# Patient Record
Sex: Female | Born: 1995
Health system: Southern US, Community
[De-identification: ages and names within clinical notes are randomized; demographics above are authoritative.]

## PROBLEM LIST (undated history)

## (undated) DIAGNOSIS — K760 Fatty (change of) liver, not elsewhere classified: Secondary | ICD-10-CM

## (undated) DIAGNOSIS — T7840XA Allergy, unspecified, initial encounter: Secondary | ICD-10-CM

## (undated) HISTORY — DX: Fatty (change of) liver, not elsewhere classified: K76.0

## (undated) HISTORY — PX: NO PAST SURGERIES: SHX2092

## (undated) HISTORY — DX: Allergy, unspecified, initial encounter: T78.40XA

---
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
## 2018-03-13 NOTE — Progress Notes (Signed)
Subjective:    Patient ID: Marissa [name], female    DOB: 08/20/1995, 23 y.o.   MRN: [id number]  HPI The patient is here for follow up.  Anxiety: two years ago she was on fluoxetine for anxiety and she thought it worked well.  She stopped it after a few months because she did not think she needed it anymore.  Over the past two months she has been very anxious.  Her parents have noticed her anxiety and encouraged her to come in.  She has difficulty falling asleep but once asleep sleeps well, feels tired all the time, she has a decreased appetite and has had palpitations on occasion.  She denies any depression.  She would like to go back on the fluoxetine.  She is a Holiday representative at [employer].  Medications and allergies reviewed with patient and updated if appropriate.  Patient Active Problem List   Diagnosis Date Noted  . Anxiety 12/21/2015    Current Outpatient Medications on File Prior to Visit  Medication Sig Dispense Refill  . FLUoxetine (PROZAC) 20 MG tablet Take 20 mg by mouth daily.     No current facility-administered medications on file prior to visit.     Past Medical History:  Diagnosis Date  . Allergy     Past Surgical History:  Procedure Laterality Date  . NO PAST SURGERIES      Social History   Socioeconomic History  . Marital status: Single    Spouse name: Not on file  . Number of children: Not on file  . Years of education: Not on file  . Highest education level: Not on file  Occupational History  . Not on file  Social Needs  . Financial resource strain: Not on file  . Food insecurity:    Worry: Not on file    Inability: Not on file  . Transportation needs:    Medical: Not on file    Non-medical: Not on file  Tobacco Use  . Smoking status: Never Smoker  . Smokeless tobacco: Never Used  Substance and Sexual Activity  . Alcohol use: No  . Drug use: No  . Sexual activity: Not on file  Lifestyle  . Physical activity:    Days per  week: Not on file    Minutes per session: Not on file  . Stress: Not on file  Relationships  . Social connections:    Talks on phone: Not on file    Gets together: Not on file    Attends religious service: Not on file    Active member of club or organization: Not on file    Attends meetings of clubs or organizations: Not on file    Relationship status: Not on file  Other Topics Concern  . Not on file  Social History Narrative  . Not on file    Family History  Problem Relation Age of Onset  . Heart disease Father   . Renal cancer Maternal Grandfather   . Cancer Maternal Grandfather        kidney    Review of Systems  Constitutional: Positive for appetite change (decreased).  Respiratory: Negative for shortness of breath.   Cardiovascular: Positive for palpitations (occ with anxiety). Negative for chest pain.  Gastrointestinal: Positive for nausea (sometimes in morning).  Neurological: Positive for light-headedness. Negative for headaches.  Psychiatric/Behavioral: Positive for sleep disturbance (falling asleep only). Negative for decreased concentration and dysphoric mood. The patient is nervous/anxious.  Objective:   Vitals:   03/14/18 0743  BP: 108/70  Pulse: 74  Resp: 16  Temp: 98.4 F (36.9 C)  SpO2: 98%   BP Readings from Last 3 Encounters:  03/14/18 108/70  05/24/16 104/76  12/21/15 104/74   Wt Readings from Last 3 Encounters:  03/14/18 185 lb (83.9 kg)  05/24/16 203 lb (92.1 kg) (98 %, Z= 1.99)*  12/21/15 201 lb (91.2 kg) (98 %, Z= 1.97)*   * Growth percentiles are based on CDC (Girls, 2-20 Years) data.   Body mass index is 28.13 kg/m.   Physical Exam    Constitutional: Appears well-developed and well-nourished. No distress.  HENT:  Head: Normocephalic and atraumatic.  Neck: Neck supple. No tracheal deviation present. No thyromegaly present.  No cervical lymphadenopathy Cardiovascular: Normal rate, regular rhythm and normal heart sounds.     No murmur heard. No carotid bruit .  No edema Pulmonary/Chest: Effort normal and breath sounds normal. No respiratory distress. No has no wheezes. No rales.  Skin: Skin is warm and dry. Not diaphoretic.  Psychiatric: Normal mood and affect. Behavior is normal.      Assessment & Plan:    See Problem List for Assessment and Plan of chronic medical problems.

## 2018-03-14 ENCOUNTER — Ambulatory Visit: Payer: BLUE CROSS/BLUE SHIELD | Admitting: Internal Medicine

## 2018-03-14 ENCOUNTER — Encounter: Payer: Self-pay | Admitting: Internal Medicine

## 2018-03-14 VITALS — BP 108/70 | HR 74 | Temp 98.4°F | Resp 16 | Ht 68.0 in | Wt 185.0 lb

## 2018-03-14 DIAGNOSIS — F419 Anxiety disorder, unspecified: Secondary | ICD-10-CM | POA: Diagnosis not present

## 2018-03-14 DIAGNOSIS — Z23 Encounter for immunization: Secondary | ICD-10-CM | POA: Diagnosis not present

## 2018-03-14 MED ORDER — FLUOXETINE HCL 20 MG PO TABS: 20.0000 mg | ORAL_TABLET | Freq: Every day | ORAL | 1 refills | Status: DC

## 2018-03-14 NOTE — Assessment & Plan Note (Signed)
Like to restart fluoxetine-we will restart at 20 mg daily She has taken the medication before and tolerated it well.  She will let me know if this is not helping. Discussed that this is no medication she should take if she was to become pregnant Encouraged regular exercise Follow-up in 6 months, sooner if needed

## 2018-03-14 NOTE — Patient Instructions (Addendum)
  Flu immunization administered today.    Medications reviewed and updated.  Changes include :   Restart fluoxetine.  Your prescription(s) have been submitted to your pharmacy. Please take as directed and contact our office if you believe you are having problem(s) with the medication(s).   Please followup in 6 months

## 2018-03-14 NOTE — Addendum Note (Signed)
Addended by: Mercer Pod E on: 03/14/2018 09:08 AM   Modules accepted: Orders

## 2018-08-29 ENCOUNTER — Telehealth: Payer: Self-pay | Admitting: Internal Medicine

## 2018-08-29 DIAGNOSIS — Z20822 Contact with and (suspected) exposure to covid-19: Secondary | ICD-10-CM

## 2018-08-29 NOTE — Telephone Encounter (Signed)
Dr. Quay Burow is ok with pt getting tested for COVID. Was recently at [facility] but has no symptoms. Call back number [phone]

## 2018-08-29 NOTE — Telephone Encounter (Signed)
Patient was recently in [facility] and is now concerned with possibly having COVID. She is showing no symptoms.  Per Dr Quay Burow, okay to send order for [facility] to schedule testing.

## 2018-08-29 NOTE — Telephone Encounter (Signed)
Scheduled patient for testing 09/02/2018 at [facility] at 8:30 am.  Testing protocol reviewed with patient.

## 2018-09-02 ENCOUNTER — Other Ambulatory Visit: Payer: BLUE CROSS/BLUE SHIELD

## 2018-09-02 DIAGNOSIS — R6889 Other general symptoms and signs: Secondary | ICD-10-CM | POA: Diagnosis not present

## 2018-09-02 DIAGNOSIS — Z20822 Contact with and (suspected) exposure to covid-19: Secondary | ICD-10-CM

## 2018-09-07 LAB — NOVEL CORONAVIRUS, NAA: SARS-CoV-2, NAA: NOT DETECTED

## 2018-09-12 NOTE — Progress Notes (Signed)
Subjective:    Patient ID: Marissa [name], female    DOB: 1995/10/28, 23 y.o.   MRN: [id number]  HPI The patient is here for follow up.  We restarted her Prozac last January.  She had stopped it a few months earlier because she did not think she needed anymore, but started to feel more anxious.  Anxiety: She is taking her medication daily as prescribed. She denies any side effects from the medication. She feels her anxiety is well controlled and she is happy with her current dose of medication.    Knee pain:  She played sports when she was younger and has been having knee pain.  Her knees crunch.  She can not sit with her legs crisscrossed because it hurts.  She denies pain with going up stairs.  She has soreness in her knees after standing long periods.  She works out pretty regularly.     Medications and allergies reviewed with patient and updated if appropriate.  Patient Active Problem List   Diagnosis Date Noted  . Anxiety 12/21/2015    Current Outpatient Medications on File Prior to Visit  Medication Sig Dispense Refill  . FLUoxetine (PROZAC) 20 MG tablet Take 1 tablet (20 mg total) by mouth daily. 90 tablet 1   No current facility-administered medications on file prior to visit.     Past Medical History:  Diagnosis Date  . Allergy     Past Surgical History:  Procedure Laterality Date  . NO PAST SURGERIES      Social History   Socioeconomic History  . Marital status: Single    Spouse name: Not on file  . Number of children: Not on file  . Years of education: Not on file  . Highest education level: Not on file  Occupational History  . Not on file  Social Needs  . Financial resource strain: Not on file  . Food insecurity    Worry: Not on file    Inability: Not on file  . Transportation needs    Medical: Not on file    Non-medical: Not on file  Tobacco Use  . Smoking status: Never Smoker  . Smokeless tobacco: Never Used  Substance and Sexual Activity   . Alcohol use: No  . Drug use: No  . Sexual activity: Not on file  Lifestyle  . Physical activity    Days per week: Not on file    Minutes per session: Not on file  . Stress: Not on file  Relationships  . Social Herbalist on phone: Not on file    Gets together: Not on file    Attends religious service: Not on file    Active member of club or organization: Not on file    Attends meetings of clubs or organizations: Not on file    Relationship status: Not on file  Other Topics Concern  . Not on file  Social History Narrative  . Not on file    Family History  Problem Relation Age of Onset  . Heart disease Father   . Renal cancer Maternal Grandfather   . Cancer Maternal Grandfather        kidney    Review of Systems  Constitutional: Negative for appetite change.  Gastrointestinal: Negative for nausea.  Musculoskeletal: Positive for arthralgias (knee pain).  Neurological: Negative for headaches.  Psychiatric/Behavioral: Positive for sleep disturbance. Negative for dysphoric mood. The patient is nervous/anxious.  Objective:   Vitals:   09/13/18 1405  BP: 110/80  Pulse: 86  Temp: 98.5 F (36.9 C)  SpO2: 98%   BP Readings from Last 3 Encounters:  09/13/18 110/80  03/14/18 108/70  05/24/16 104/76   Wt Readings from Last 3 Encounters:  09/13/18 186 lb 4 oz (84.5 kg)  03/14/18 185 lb (83.9 kg)  05/24/16 203 lb (92.1 kg) (98 %, Z= 1.99)*   * Growth percentiles are based on CDC (Girls, 2-20 Years) data.   Body mass index is 28.32 kg/m.   Physical Exam    Constitutional: Appears well-developed and well-nourished. No distress.  KNEES: Skin intact without bruising.  No swelling or effusion.  Normal range of motion of both knees. MSK: No calf tenderness Skin: Skin is warm and dry. Not diaphoretic.  Psychiatric: Normal mood and affect. Behavior is normal.      Assessment & Plan:    See Problem List for Assessment and Plan of chronic  medical problems.

## 2018-09-13 ENCOUNTER — Other Ambulatory Visit: Payer: Self-pay

## 2018-09-13 ENCOUNTER — Encounter: Payer: Self-pay | Admitting: Internal Medicine

## 2018-09-13 ENCOUNTER — Ambulatory Visit (INDEPENDENT_AMBULATORY_CARE_PROVIDER_SITE_OTHER): Payer: BC Managed Care – PPO | Admitting: Internal Medicine

## 2018-09-13 VITALS — BP 110/80 | HR 86 | Temp 98.5°F | Ht 68.0 in | Wt 186.2 lb

## 2018-09-13 DIAGNOSIS — G8929 Other chronic pain: Secondary | ICD-10-CM | POA: Diagnosis not present

## 2018-09-13 DIAGNOSIS — F419 Anxiety disorder, unspecified: Secondary | ICD-10-CM | POA: Diagnosis not present

## 2018-09-13 DIAGNOSIS — M25569 Pain in unspecified knee: Secondary | ICD-10-CM | POA: Insufficient documentation

## 2018-09-13 DIAGNOSIS — M25562 Pain in left knee: Secondary | ICD-10-CM

## 2018-09-13 DIAGNOSIS — M25561 Pain in right knee: Secondary | ICD-10-CM | POA: Diagnosis not present

## 2018-09-13 DIAGNOSIS — Z23 Encounter for immunization: Secondary | ICD-10-CM | POA: Diagnosis not present

## 2018-09-13 NOTE — Assessment & Plan Note (Signed)
Anxiety currently well controlled Tolerating medication without side effects Continue fluoxetine 20 mg daily Follow-up in 6 months, sooner if needed

## 2018-09-13 NOTE — Assessment & Plan Note (Signed)
Mild pain overall No acute injury Crunching/popping is not associated with pain-reassured No concerning findings on exam Continue regular exercise and strengthening of the muscles around the knee Avoid activities that cause pain If concerns continue consider sports medicine

## 2018-09-13 NOTE — Patient Instructions (Signed)
Continue your current medication.   Follow up in 6 months sooner if needed.

## 2018-09-20 ENCOUNTER — Ambulatory Visit: Payer: BC Managed Care – PPO

## 2018-09-23 ENCOUNTER — Other Ambulatory Visit: Payer: Self-pay

## 2018-09-23 ENCOUNTER — Ambulatory Visit (INDEPENDENT_AMBULATORY_CARE_PROVIDER_SITE_OTHER): Payer: BC Managed Care – PPO

## 2018-09-23 DIAGNOSIS — Z299 Encounter for prophylactic measures, unspecified: Secondary | ICD-10-CM

## 2018-09-23 DIAGNOSIS — Z23 Encounter for immunization: Secondary | ICD-10-CM | POA: Diagnosis not present

## 2018-10-22 DIAGNOSIS — Z6828 Body mass index (BMI) 28.0-28.9, adult: Secondary | ICD-10-CM | POA: Diagnosis not present

## 2018-10-22 DIAGNOSIS — Z01419 Encounter for gynecological examination (general) (routine) without abnormal findings: Secondary | ICD-10-CM | POA: Diagnosis not present

## 2018-10-22 DIAGNOSIS — Z118 Encounter for screening for other infectious and parasitic diseases: Secondary | ICD-10-CM | POA: Diagnosis not present

## 2018-10-24 ENCOUNTER — Ambulatory Visit (INDEPENDENT_AMBULATORY_CARE_PROVIDER_SITE_OTHER): Payer: BC Managed Care – PPO

## 2018-10-24 DIAGNOSIS — Z23 Encounter for immunization: Secondary | ICD-10-CM

## 2018-10-29 ENCOUNTER — Other Ambulatory Visit: Payer: Self-pay | Admitting: Internal Medicine

## 2018-11-24 NOTE — Progress Notes (Signed)
xx This encounter was created in error - please disregard.

## 2018-11-25 ENCOUNTER — Other Ambulatory Visit: Payer: Self-pay | Admitting: Internal Medicine

## 2018-11-25 ENCOUNTER — Encounter: Payer: BC Managed Care – PPO | Admitting: Internal Medicine

## 2018-11-25 NOTE — Telephone Encounter (Signed)
Pt has a appt today @ 2:45 forwarding to Joes until she arrive for appt to refill....Johny Chess

## 2018-11-26 ENCOUNTER — Other Ambulatory Visit: Payer: Self-pay

## 2018-11-26 ENCOUNTER — Other Ambulatory Visit: Payer: Self-pay | Admitting: Internal Medicine

## 2018-11-26 DIAGNOSIS — Z20822 Contact with and (suspected) exposure to covid-19: Secondary | ICD-10-CM

## 2018-11-26 MED ORDER — FLUOXETINE HCL 20 MG PO TABS: 20.0000 mg | ORAL_TABLET | Freq: Every day | ORAL | 0 refills | Status: DC

## 2018-11-27 LAB — NOVEL CORONAVIRUS, NAA: SARS-CoV-2, NAA: NOT DETECTED

## 2018-12-03 ENCOUNTER — Encounter: Payer: Self-pay | Admitting: Internal Medicine

## 2018-12-04 ENCOUNTER — Other Ambulatory Visit: Payer: Self-pay

## 2018-12-04 DIAGNOSIS — Z20828 Contact with and (suspected) exposure to other viral communicable diseases: Secondary | ICD-10-CM | POA: Diagnosis not present

## 2018-12-04 DIAGNOSIS — Z20822 Contact with and (suspected) exposure to covid-19: Secondary | ICD-10-CM

## 2018-12-06 LAB — NOVEL CORONAVIRUS, NAA: SARS-CoV-2, NAA: DETECTED — AB

## 2019-02-05 ENCOUNTER — Ambulatory Visit (INDEPENDENT_AMBULATORY_CARE_PROVIDER_SITE_OTHER)
Admission: RE | Admit: 2019-02-05 | Discharge: 2019-02-05 | Disposition: A | Discharge status: Home / Self Care | Payer: BC Managed Care – PPO | Source: Ambulatory Visit | Attending: Internal Medicine | Admitting: Internal Medicine

## 2019-02-05 ENCOUNTER — Ambulatory Visit (INDEPENDENT_AMBULATORY_CARE_PROVIDER_SITE_OTHER): Payer: BC Managed Care – PPO | Admitting: Internal Medicine

## 2019-02-05 ENCOUNTER — Encounter: Payer: Self-pay | Admitting: Internal Medicine

## 2019-02-05 ENCOUNTER — Other Ambulatory Visit: Payer: Self-pay

## 2019-02-05 VITALS — BP 118/80 | HR 93 | Temp 98.6°F | Resp 16 | Ht 68.0 in

## 2019-02-05 DIAGNOSIS — S99912A Unspecified injury of left ankle, initial encounter: Secondary | ICD-10-CM | POA: Diagnosis not present

## 2019-02-05 DIAGNOSIS — M25572 Pain in left ankle and joints of left foot: Secondary | ICD-10-CM

## 2019-02-05 DIAGNOSIS — R600 Localized edema: Secondary | ICD-10-CM | POA: Diagnosis not present

## 2019-02-05 NOTE — Progress Notes (Signed)
Subjective:    Patient ID: Marissa [name], female    DOB: 03-12-1995, 23 y.o.   MRN: [id number]  HPI The patient is here for an acute visit.   Left ankle pain:   12/8 and she stepped out of a door and it was lower than she thought.  She rolled her ankle and heard a snap and fell.  She had instant pain.  She was able to hobble last night, but could not bear full weight on it.  She did ice it.  She took ibuprofen last night and today.  Today cant not walk. She has pain and swelling on the lateral aspect of the ankle.  She does not think there is bruising but did not look.  She denied other injuries.       Medications and allergies reviewed with patient and updated if appropriate.  Patient Active Problem List   Diagnosis Date Noted  . Knee pain 09/13/2018  . Anxiety 12/21/2015    Current Outpatient Medications on File Prior to Visit  Medication Sig Dispense Refill  . FLUoxetine (PROZAC) 20 MG tablet Take 1 tablet (20 mg total) by mouth daily. -- Office visit needed for further refills 30 tablet 0   No current facility-administered medications on file prior to visit.     Past Medical History:  Diagnosis Date  . Allergy     Past Surgical History:  Procedure Laterality Date  . NO PAST SURGERIES      Social History   Socioeconomic History  . Marital status: Single    Spouse name: Not on file  . Number of children: Not on file  . Years of education: Not on file  . Highest education level: Not on file  Occupational History  . Not on file  Social Needs  . Financial resource strain: Not on file  . Food insecurity    Worry: Not on file    Inability: Not on file  . Transportation needs    Medical: Not on file    Non-medical: Not on file  Tobacco Use  . Smoking status: Never Smoker  . Smokeless tobacco: Never Used  Substance and Sexual Activity  . Alcohol use: No  . Drug use: No  . Sexual activity: Not on file  Lifestyle  . Physical activity    Days per week:  Not on file    Minutes per session: Not on file  . Stress: Not on file  Relationships  . Social Herbalist on phone: Not on file    Gets together: Not on file    Attends religious service: Not on file    Active member of club or organization: Not on file    Attends meetings of clubs or organizations: Not on file    Relationship status: Not on file  Other Topics Concern  . Not on file  Social History Narrative  . Not on file    Family History  Problem Relation Age of Onset  . Heart disease Father   . Renal cancer Maternal Grandfather   . Cancer Maternal Grandfather        kidney    Review of Systems  Constitutional: Negative for chills and fever.  Musculoskeletal:       Left ankle swelling and pain  Skin: Negative for color change and wound.       Objective:   Vitals:   02/05/19 1418  BP: 118/80  Pulse: 93  Resp: 16  Temp:  98.6 F (37 C)  SpO2: 97%   BP Readings from Last 3 Encounters:  02/05/19 118/80  09/13/18 110/80  03/14/18 108/70   Wt Readings from Last 3 Encounters:  09/13/18 186 lb 4 oz (84.5 kg)  03/14/18 185 lb (83.9 kg)  05/24/16 203 lb (92.1 kg) (98 %, Z= 1.99)*   * Growth percentiles are based on CDC (Girls, 2-20 Years) data.   Body mass index is 28.32 kg/m.   Physical Exam Constitutional:      General: She is not in acute distress.    Appearance: Normal appearance. She is not ill-appearing.  HENT:     Head: Normocephalic and atraumatic.  Musculoskeletal:        General: Swelling and tenderness (left lateral ankle, increaesd pain iwth inversion) present. No deformity.  Skin:    General: Skin is warm and dry.     Findings: No bruising or erythema.  Neurological:     Mental Status: She is alert.     Sensory: No sensory deficit.            Assessment & Plan:    See Problem List for Assessment and Plan of chronic medical problems.

## 2019-02-05 NOTE — Patient Instructions (Signed)
Have an x-ray today.     Continue ibuprofen for now.

## 2019-02-05 NOTE — Assessment & Plan Note (Signed)
Left lateral malleolus pain after fall and twisting of ankle last night Tenderness, swelling Xray to r/o fx - negative for fx Probable ankle sprain Continue ibuprofen Crutches  - stay off foot To see sports med tomorrow

## 2019-02-06 ENCOUNTER — Ambulatory Visit: Payer: Self-pay

## 2019-02-06 ENCOUNTER — Other Ambulatory Visit: Payer: Self-pay

## 2019-02-06 ENCOUNTER — Ambulatory Visit (INDEPENDENT_AMBULATORY_CARE_PROVIDER_SITE_OTHER): Payer: BC Managed Care – PPO | Admitting: Family Medicine

## 2019-02-06 ENCOUNTER — Encounter: Payer: Self-pay | Admitting: Family Medicine

## 2019-02-06 VITALS — BP 120/80 | HR 108 | Ht 68.0 in | Wt 186.0 lb

## 2019-02-06 DIAGNOSIS — M25572 Pain in left ankle and joints of left foot: Secondary | ICD-10-CM

## 2019-02-06 NOTE — Assessment & Plan Note (Signed)
Patient is ATFL is intact the patient does have a mild joint effusion noted.  Discussed with patient icing regimen, home exercises, topical anti-inflammatories.  Discussed Aircast at the moment.  Patient will do 4 more days of crutches.  Follow-up in 3 weeks if not completely better.

## 2019-02-06 NOTE — Patient Instructions (Signed)
Good to see you.  Ice 20 minutes 2 times daily. Usually after activity and before bed. Exercises 3 times a week on Monday  pennsaid pinkie amount topically 2 times daily as needed.  Vitamin D 2000 IU daily  Crutches through weekend Aircast 2 weeks See me again in 3 weeks

## 2019-02-06 NOTE — Progress Notes (Signed)
[name] [place], [place] [address] Phone: [phone] Subjective:   Marissa [name], am serving as a scribe for Dr. Hulan Saas. This visit occurred during the SARS-CoV-2 public health emergency.  Safety protocols were in place, including screening questions prior to the visit, additional usage of staff PPE, and extensive cleaning of exam room while observing appropriate contact time as indicated for disinfecting solutions.   I'm seeing this patient by the request  of:   Binnie Rail, MD   This visit occurred during the SARS-CoV-2 public health emergency.  Safety protocols were in place, including screening questions prior to the visit, additional usage of staff PPE, and extensive cleaning of exam room while observing appropriate contact time as indicated for disinfecting solutions.    CC: Left ankle injury  [id number]  Marissa [name] is a 23 y.o. female coming in with complaint of left ankle sprain. Patient rolled ankle when coming down the stairs on 02/04/2019. Felt a pop in her ankle. Pain over lateral malleolus. Pain is better this morning. Was in constant pain but is only in pain with weight bearing.      Patient did have an x-ray yesterday.  This was independently visualized by me showing [name] acute fracture.  Past Medical History:  Diagnosis Date  . Allergy    Past Surgical History:  Procedure Laterality Date  . [name] PAST SURGERIES     Social History   Socioeconomic History  . Marital status: Single    Spouse name: Not on file  . Number of children: Not on file  . Years of education: Not on file  . Highest education level: Not on file  Occupational History  . Not on file  Tobacco Use  . Smoking status: Never Smoker  . Smokeless tobacco: Never Used  Substance and Sexual Activity  . Alcohol use: [name]  . Drug use: [name]  . Sexual activity: Not on file  Other Topics Concern  . Not on file  Social History Narrative  .  Not on file   Social Determinants of Health   Financial Resource Strain:   . Difficulty of Paying Living Expenses: Not on file  Food Insecurity:   . Worried About Charity fundraiser in the Last Year: Not on file  . Ran Out of Food in the Last Year: Not on file  Transportation Needs:   . Lack of Transportation (Medical): Not on file  . Lack of Transportation (Non-Medical): Not on file  Physical Activity:   . Days of Exercise per Week: Not on file  . Minutes of Exercise per Session: Not on file  Stress:   . Feeling of Stress : Not on file  Social Connections:   . Frequency of Communication with Friends and Family: Not on file  . Frequency of Social Gatherings with Friends and Family: Not on file  . Attends Religious Services: Not on file  . Active Member of Clubs or Organizations: Not on file  . Attends Archivist Meetings: Not on file  . Marital Status: Not on file   [name] Known Allergies Family History  Problem Relation Age of Onset  . Heart disease Father   . Renal cancer Maternal Grandfather   . Cancer Maternal Grandfather        kidney         Current Outpatient Medications (Other):  Marland Kitchen  FLUoxetine (PROZAC) 20 MG tablet, Take 1 tablet (20 mg total) by  mouth daily. -- Office visit needed for further refills    Past medical history, social, surgical and family history all reviewed in electronic medical record.  [name] pertanent information unless stated regarding to the chief complaint.   Review of Systems:  [name] headache, visual changes, nausea, vomiting, diarrhea, constipation, dizziness, abdominal pain, skin rash, fevers, chills, night sweats, weight loss, swollen lymph nodes, body aches, joint swelling, muscle aches, chest pain, shortness of breath, mood changes.   Objective  Last menstrual period 01/17/2019. Systems examined below as of    General: [name] apparent distress alert and oriented x3 mood and affect normal, dressed appropriately.  HEENT: Pupils  equal, extraocular movements intact  Respiratory: Patient's speak in full sentences and does not appear short of breath  Cardiovascular: [name] lower extremity edema, non tender, [name] erythema  Skin: Warm dry intact with [name] signs of infection or rash on extremities or on axial skeleton.  Abdomen: Soft nontender  Neuro: Cranial nerves II through XII are intact, neurovascularly intact in all extremities with 2+ DTRs and 2+ pulses.  Lymph: [name] lymphadenopathy of posterior or anterior cervical chain or axillae bilaterally.  Gait severely antalgic MSK:  Non tender with full range of motion and good stability and symmetric strength and tone of shoulders, elbows, wrist, hip, knee bilaterally.   Ankle: Left Visible swelling laterally ankle Range of motion is full in all directions. Strength is 5/5 in all directions. Stable lateral and medial ligaments; squeeze test and kleiger test unremarkable; Talar dome nontender; [name] pain at base of 5th MT; [name] tenderness over cuboid; [name] tenderness over N spot or navicular prominence [name] tenderness on posterior aspects of lateral and medial malleolus [name] sign of peroneal tendon subluxations or tenderness to palpation Negative tarsal tunnel tinel's patient though is tender over the ATFL. Able to walk 4 steps.  MSK US performed of: Left ankle  this study was ordered, performed, and interpreted by Terrilee Files D.O.  Foot/Ankle:   All structures visualized.   Talar dome trace effusion laterally noted Ankle mortise mild effusion. Peroneus longus and brevis tendons unremarkable on long and transverse views without sheath effusions. Posterior tibialis, flexor hallucis longus, and flexor digitorum longus tendons unremarkable on long and transverse views without sheath effusions. Achilles tendon visualized along length of tendon and unremarkable on long and transverse views without sheath effusion. Anterior Talofibular Ligament and Calcaneofibular Ligaments unremarkable and  intact.   IMPRESSION: Likely grade 1 ankle sprain  72536; 15 additional minutes spent for Therapeutic exercises as stated in above notes.  This included exercises focusing on stretching, strengthening, with significant focus on eccentric aspects.   Long term goals include an improvement in range of motion, strength, endurance as well as avoiding reinjury. Patient's frequency would include in 1-2 times a day, 3-5 times a week for a duration of 6-12 weeks.  Ankle strengthening that included:  Basic range of motion exercises to allow proper full motion at ankle Stretching of the lower leg and hamstrings  Theraband exercises for the lower leg - inversion, eversion, dorsiflexion and plantarflexion each to be completed with a theraband given  Balance exercises to increase proprioception Weight bearing exercises to increase strength and balance Proper technique shown and discussed handout in great detail with ATC.  All questions were discussed and answered.      Impression and Recommendations:     This case required medical decision making of moderate complexity. The above documentation has been reviewed and is accurate and complete Marissa Saa, DO  Note: This dictation was prepared with Dragon dictation along with smaller phrase technology. Any transcriptional errors that result from this process are unintentional.

## 2019-02-18 ENCOUNTER — Other Ambulatory Visit: Payer: Self-pay | Admitting: Internal Medicine

## 2019-02-26 ENCOUNTER — Other Ambulatory Visit: Payer: Self-pay

## 2019-02-26 ENCOUNTER — Ambulatory Visit (INDEPENDENT_AMBULATORY_CARE_PROVIDER_SITE_OTHER): Payer: BC Managed Care – PPO | Admitting: Family Medicine

## 2019-02-26 ENCOUNTER — Ambulatory Visit (INDEPENDENT_AMBULATORY_CARE_PROVIDER_SITE_OTHER): Payer: BC Managed Care – PPO

## 2019-02-26 ENCOUNTER — Encounter: Payer: Self-pay | Admitting: Family Medicine

## 2019-02-26 VITALS — BP 112/78 | HR 96 | Ht 68.0 in | Wt 189.0 lb

## 2019-02-26 DIAGNOSIS — M25572 Pain in left ankle and joints of left foot: Secondary | ICD-10-CM

## 2019-02-26 NOTE — Patient Instructions (Signed)
Brace with running for one month Ice after activity See me in 6 weeks if not perfect Happy New Year

## 2019-02-26 NOTE — Progress Notes (Signed)
[name] [address] [place] [address] Phone: [phone] Subjective:      CC: Ankle pain follow-up  VQM:[id number]   I, Marissa [name], am serving as a scribe for Dr. Antoine Primas. This visit occurred during the SARS-CoV-2 public health emergency.  Safety protocols were in place, including screening questions prior to the visit, additional usage of staff PPE, and extensive cleaning of exam room while observing appropriate contact time as indicated for disinfecting solutions.   02/06/2019 Patient is ATFL is intact the patient does have a mild joint effusion noted.  Discussed with patient icing regimen, home exercises, topical anti-inflammatories.  Discussed Aircast at the moment.  Patient will do 4 more days of crutches.  Follow-up in 3 weeks if not completely better.  Update 02/26/2019 Marissa [name] is a 22 y.o. female coming in with complaint of left ankle pain. Pain increases with weight bearing. Pain is dull after activity. Has been wearing Aircast since last visit. Having pain medial and lateral aspect of ankle. Feels improvement since last visit.    Patient states about 80% better.  Having very minimal discomfort and pain at the moment.  Past Medical History:  Diagnosis Date  . Allergy    Past Surgical History:  Procedure Laterality Date  . NO PAST SURGERIES     Social History   Socioeconomic History  . Marital status: Single    Spouse name: Not on file  . Number of children: Not on file  . Years of education: Not on file  . Highest education level: Not on file  Occupational History  . Not on file  Tobacco Use  . Smoking status: Never Smoker  . Smokeless tobacco: Never Used  Substance and Sexual Activity  . Alcohol use: No  . Drug use: No  . Sexual activity: Not on file  Other Topics Concern  . Not on file  Social History Narrative  . Not on file   Social Determinants of Health   Financial Resource Strain:   .  Difficulty of Paying Living Expenses: Not on file  Food Insecurity:   . Worried About Programme researcher, broadcasting/film/video in the Last Year: Not on file  . Ran Out of Food in the Last Year: Not on file  Transportation Needs:   . Lack of Transportation (Medical): Not on file  . Lack of Transportation (Non-Medical): Not on file  Physical Activity:   . Days of Exercise per Week: Not on file  . Minutes of Exercise per Session: Not on file  Stress:   . Feeling of Stress : Not on file  Social Connections:   . Frequency of Communication with Friends and Family: Not on file  . Frequency of Social Gatherings with Friends and Family: Not on file  . Attends Religious Services: Not on file  . Active Member of Clubs or Organizations: Not on file  . Attends Banker Meetings: Not on file  . Marital Status: Not on file   No Known Allergies Family History  Problem Relation Age of Onset  . Heart disease Father   . Renal cancer Maternal Grandfather   . Cancer Maternal Grandfather        kidney         Current Outpatient Medications (Other):  Marland Kitchen  FLUoxetine (PROZAC) 20 MG tablet, Take 1 tablet (20 mg total) by mouth daily.    Past medical history, social, surgical and family history all reviewed in electronic medical record.  No pertanent information unless stated regarding to the chief complaint.   Review of Systems:  No headache, visual changes, nausea, vomiting, diarrhea, constipation, dizziness, abdominal pain, skin rash, fevers, chills, night sweats, weight loss, swollen lymph nodes, body aches, joint swelling,  chest pain, shortness of breath, mood changes.  Positive muscle aches  Objective  Blood pressure 112/78, pulse 96, height 5\' 8"  (1.727 m), weight 189 lb (85.7 kg), SpO2 99 %.    General: No apparent distress alert and oriented x3 mood and affect normal, dressed appropriately.  HEENT: Pupils equal, extraocular movements intact  Respiratory: Patient's speak in full sentences and  does not appear short of breath  Cardiovascular: No lower extremity edema, non tender, no erythema  Skin: Warm dry intact with no signs of infection or rash on extremities or on axial skeleton.  Abdomen: Soft nontender  Neuro: Cranial nerves II through XII are intact, neurovascularly intact in all extremities with 2+ DTRs and 2+ pulses.  Lymph: No lymphadenopathy of posterior or anterior cervical chain or axillae bilaterally.  Gait normal with good balance and coordination.  MSK:  tender with full range of motion and good stability and symmetric strength and tone of shoulders, elbows, wrist, hip, kneebilaterally.  Left ankle exam does have still some mild swelling noted on the lateral aspect of the ankle.  Full range of motion but does have pain with resisted eversion of the ankle.  The pain is over the ATFL.  No pain over the lateral malleolus itself.  Limited musculoskeletal ultrasound was performed and interpreted by Lyndal Pulley  Limited ultrasound of patient's ankle shows that patient does have some mild hypoechoic changes and increasing Doppler flow over the ATFL.  Mild cortical irregularity at the insertion of the ATFL could have been a healing fracture. Impression: Improvement with interval healing.   Impression and Recommendations:      The above documentation has been reviewed and is accurate and complete Lyndal Pulley, DO       Note: This dictation was prepared with Dragon dictation along with smaller phrase technology. Any transcriptional errors that result from this process are unintentional.

## 2019-03-05 ENCOUNTER — Ambulatory Visit (INDEPENDENT_AMBULATORY_CARE_PROVIDER_SITE_OTHER): Payer: 59

## 2019-03-05 ENCOUNTER — Other Ambulatory Visit: Payer: Self-pay

## 2019-03-05 DIAGNOSIS — Z23 Encounter for immunization: Secondary | ICD-10-CM

## 2019-03-05 DIAGNOSIS — Z299 Encounter for prophylactic measures, unspecified: Secondary | ICD-10-CM

## 2019-03-11 ENCOUNTER — Ambulatory Visit: Payer: 59 | Attending: Internal Medicine

## 2019-03-11 DIAGNOSIS — Z20822 Contact with and (suspected) exposure to covid-19: Secondary | ICD-10-CM

## 2019-03-13 LAB — NOVEL CORONAVIRUS, NAA: SARS-CoV-2, NAA: NOT DETECTED

## 2019-05-05 ENCOUNTER — Ambulatory Visit: Payer: 59 | Attending: Internal Medicine

## 2019-05-05 DIAGNOSIS — Z23 Encounter for immunization: Secondary | ICD-10-CM | POA: Insufficient documentation

## 2019-05-05 NOTE — Progress Notes (Signed)
   Covid-19 Vaccination Clinic  Name:  Marissa [name]    MRN: [id number] DOB: 07-27-1995  05/05/2019  [name] was observed post Covid-19 immunization for 15 minutes without incident. She was provided with Vaccine Information Sheet and instruction to access the V-Safe system.   [name] was instructed to call 911 with any severe reactions post vaccine: Marland Kitchen Difficulty breathing  . Swelling of face and throat  . A fast heartbeat  . A bad rash all over body  . Dizziness and weakness   Immunizations Administered    Name Date Dose VIS Date Route   Pfizer COVID-19 Vaccine 05/05/2019  3:45 PM 0.3 mL 02/07/2019 Intramuscular   Manufacturer: [employer], [employer]   Lot: [id number]   NDC: [address]-2

## 2019-06-04 ENCOUNTER — Ambulatory Visit: Payer: 59 | Attending: Internal Medicine

## 2019-06-04 DIAGNOSIS — Z23 Encounter for immunization: Secondary | ICD-10-CM

## 2019-06-04 NOTE — Progress Notes (Signed)
   Covid-19 Vaccination Clinic  Name:  Marissa [name]    MRN: [id number] DOB: [birth year]  06/04/2019  Marissa [name] was observed post Covid-19 immunization for 15 minutes without incident. She was provided with Vaccine Information Sheet and instruction to access the V-Safe system.   Marissa [name] was instructed to call 911 with any severe reactions post vaccine: Marland Kitchen Difficulty breathing  . Swelling of face and throat  . A fast heartbeat  . A bad rash all over body  . Dizziness and weakness   Immunizations Administered    Name Date Dose VIS Date Route   Pfizer COVID-19 Vaccine 06/04/2019  1:00 PM 0.3 mL 02/07/2019 Intramuscular   Manufacturer: [employer], [employer]   Lot: [id number]   NDC: [address]-9

## 2019-09-09 ENCOUNTER — Other Ambulatory Visit: Payer: Self-pay | Admitting: Internal Medicine

## 2019-10-21 ENCOUNTER — Other Ambulatory Visit: Payer: Self-pay | Admitting: Internal Medicine

## 2019-11-16 ENCOUNTER — Other Ambulatory Visit: Payer: Self-pay | Admitting: Internal Medicine

## 2019-11-26 ENCOUNTER — Other Ambulatory Visit: Payer: Self-pay | Admitting: Internal Medicine

## 2019-11-30 ENCOUNTER — Other Ambulatory Visit: Payer: Self-pay | Admitting: Internal Medicine

## 2019-12-08 ENCOUNTER — Other Ambulatory Visit: Payer: Self-pay | Admitting: Internal Medicine

## 2019-12-09 NOTE — Telephone Encounter (Signed)
Called pt gave her MD response. Made appt for Thurs 12/11/19 @ 7:45.Marissa KitchenRaechel Chute

## 2019-12-09 NOTE — Telephone Encounter (Signed)
Please call her - she is overdue for a follow up appt - that is why she has only been getting a 30 day supply.  There has been note to the pharmacy advising she needs a f/u appt for additional refills.

## 2019-12-10 NOTE — Progress Notes (Signed)
Subjective:    Patient ID: Marissa [name], female    DOB: Jul 24, 1995, 24 y.o.   MRN: [id number]  HPI The patient is here for follow up of their chronic medical problems, including anxiety  She is taking all of her medications as prescribed.  She is happy with her current dose.    After working out the next days she has pinching pian at her left lower back region.  She cannot straighten up when she has the pain.  Sometimes radiation to butt, but usually localized. She sometimes take ibuprofen.  She has no pain today.    Medications and allergies reviewed with patient and updated if appropriate.  Patient Active Problem List   Diagnosis Date Noted  . Acute left ankle pain 02/05/2019  . Knee pain 09/13/2018  . Anxiety 12/21/2015    No current outpatient medications on file prior to visit.   No current facility-administered medications on file prior to visit.    Past Medical History:  Diagnosis Date  . Allergy     Past Surgical History:  Procedure Laterality Date  . NO PAST SURGERIES      Social History   Socioeconomic History  . Marital status: Single    Spouse name: Not on file  . Number of children: Not on file  . Years of education: Not on file  . Highest education level: Not on file  Occupational History  . Not on file  Tobacco Use  . Smoking status: Never Smoker  . Smokeless tobacco: Never Used  Substance and Sexual Activity  . Alcohol use: No  . Drug use: No  . Sexual activity: Not on file  Other Topics Concern  . Not on file  Social History Narrative  . Not on file   Social Determinants of Health   Financial Resource Strain:   . Difficulty of Paying Living Expenses: Not on file  Food Insecurity:   . Worried About Programme researcher, broadcasting/film/video in the Last Year: Not on file  . Ran Out of Food in the Last Year: Not on file  Transportation Needs:   . Lack of Transportation (Medical): Not on file  . Lack of Transportation (Non-Medical): Not on file    Physical Activity:   . Days of Exercise per Week: Not on file  . Minutes of Exercise per Session: Not on file  Stress:   . Feeling of Stress : Not on file  Social Connections:   . Frequency of Communication with Friends and Family: Not on file  . Frequency of Social Gatherings with Friends and Family: Not on file  . Attends Religious Services: Not on file  . Active Member of Clubs or Organizations: Not on file  . Attends Banker Meetings: Not on file  . Marital Status: Not on file    Family History  Problem Relation Age of Onset  . Heart disease Father   . Renal cancer Maternal Grandfather   . Cancer Maternal Grandfather        kidney    Review of Systems     Objective:   Vitals:   12/11/19 0750  BP: 104/78  Pulse: 90  Temp: 98 F (36.7 C)  SpO2: 97%   BP Readings from Last 3 Encounters:  12/11/19 104/78  02/26/19 112/78  02/06/19 120/80   Wt Readings from Last 3 Encounters:  12/11/19 191 lb (86.6 kg)  02/26/19 189 lb (85.7 kg)  02/06/19 186 lb (84.4 kg)   Body  mass index is 29.04 kg/m.   Physical Exam    Constitutional: Appears well-developed and well-nourished. No distress.  HENT:  Head: Normocephalic and atraumatic.  Cardiovascular: Normal rate, regular rhythm and normal heart sounds.   No murmur heard. No carotid bruit .  No edema Pulmonary/Chest: Effort normal and breath sounds normal. No respiratory distress. No has no wheezes. No rales.  Msk; area of pain is L SI joint - non-tenderness w palpation, no lumbar spine tendernss Skin: Skin is warm and dry. Not diaphoretic.  Psychiatric: Normal mood and affect. Behavior is normal.      Assessment & Plan:    See Problem List for Assessment and Plan of chronic medical problems.    This visit occurred during the SARS-CoV-2 public health emergency.  Safety protocols were in place, including screening questions prior to the visit, additional usage of staff PPE, and extensive cleaning of  exam room while observing appropriate contact time as indicated for disinfecting solutions.

## 2019-12-10 NOTE — Patient Instructions (Addendum)
Flu immunization administered today.     Medications reviewed and updated.  Changes include :   none  Your prescription(s) have been submitted to your pharmacy. Please take as directed and contact our office if you believe you are having problem(s) with the medication(s).    Please followup in 1 year

## 2019-12-11 ENCOUNTER — Other Ambulatory Visit: Payer: Self-pay

## 2019-12-11 ENCOUNTER — Encounter: Payer: Self-pay | Admitting: Internal Medicine

## 2019-12-11 ENCOUNTER — Ambulatory Visit: Payer: No Typology Code available for payment source | Admitting: Internal Medicine

## 2019-12-11 VITALS — BP 104/78 | HR 90 | Temp 98.0°F | Ht 68.0 in | Wt 191.0 lb

## 2019-12-11 DIAGNOSIS — M533 Sacrococcygeal disorders, not elsewhere classified: Secondary | ICD-10-CM | POA: Diagnosis not present

## 2019-12-11 DIAGNOSIS — Z23 Encounter for immunization: Secondary | ICD-10-CM

## 2019-12-11 DIAGNOSIS — F419 Anxiety disorder, unspecified: Secondary | ICD-10-CM

## 2019-12-11 MED ORDER — FLUOXETINE HCL 20 MG PO TABS: 20.0000 mg | ORAL_TABLET | Freq: Every day | ORAL | 3 refills | Status: DC

## 2019-12-11 NOTE — Assessment & Plan Note (Signed)
New problem Intermittent pain left SI joint pain-usually occurs the day after working out No current pain today She is unsure if it is any specific exerciser thing that she does Advised her to monitor to see if there is any pattern to it Can try ibuprofen, ice, heat Discussed possible referral to sports medicine

## 2019-12-11 NOTE — Assessment & Plan Note (Addendum)
Chronic Controlled, stable Continue fluoxetine 20 mg daily Has been very stable on this dose-follow-up in 1 year, sooner if needed

## 2019-12-31 DIAGNOSIS — R5383 Other fatigue: Secondary | ICD-10-CM | POA: Insufficient documentation

## 2019-12-31 NOTE — Progress Notes (Signed)
Subjective:    Patient ID: Marissa [name], female    DOB: 1995/03/19, 24 y.o.   MRN: [id number]  HPI The patient is here for an acute visit for Fatigue even after sleeping 11-12 hrs., terrible menstrual cycles, ? Iron def. her mother has iron deficiency, which is why she is concerned.  She feels exhausted all the time.  She sometimes gets a lot of sleep (11-12hrs) and is still tired.    She has heavy menses the first day or two.  Her periods are more painful, but regular.     Medications and allergies reviewed with patient and updated if appropriate.  Patient Active Problem List   Diagnosis Date Noted  . Fatigue 12/31/2019  . Disorder of SI (sacroiliac) joint 12/11/2019  . Knee pain 09/13/2018  . Anxiety 12/21/2015    Current Outpatient Medications on File Prior to Visit  Medication Sig Dispense Refill  . FLUoxetine (PROZAC) 20 MG tablet Take 1 tablet (20 mg total) by mouth daily. 90 tablet 3   No current facility-administered medications on file prior to visit.    Past Medical History:  Diagnosis Date  . Allergy     Past Surgical History:  Procedure Laterality Date  . NO PAST SURGERIES      Social History   Socioeconomic History  . Marital status: Single    Spouse name: Not on file  . Number of children: Not on file  . Years of education: Not on file  . Highest education level: Not on file  Occupational History  . Not on file  Tobacco Use  . Smoking status: Never Smoker  . Smokeless tobacco: Never Used  Substance and Sexual Activity  . Alcohol use: No  . Drug use: No  . Sexual activity: Not on file  Other Topics Concern  . Not on file  Social History Narrative  . Not on file   Social Determinants of Health   Financial Resource Strain:   . Difficulty of Paying Living Expenses: Not on file  Food Insecurity:   . Worried About Programme researcher, broadcasting/film/video in the Last Year: Not on file  . Ran Out of Food in the Last Year: Not on file  Transportation Needs:    . Lack of Transportation (Medical): Not on file  . Lack of Transportation (Non-Medical): Not on file  Physical Activity:   . Days of Exercise per Week: Not on file  . Minutes of Exercise per Session: Not on file  Stress:   . Feeling of Stress : Not on file  Social Connections:   . Frequency of Communication with Friends and Family: Not on file  . Frequency of Social Gatherings with Friends and Family: Not on file  . Attends Religious Services: Not on file  . Active Member of Clubs or Organizations: Not on file  . Attends Banker Meetings: Not on file  . Marital Status: Not on file    Family History  Problem Relation Age of Onset  . Heart disease Father   . Renal cancer Maternal Grandfather   . Cancer Maternal Grandfather        kidney    Review of Systems  Constitutional: Positive for fatigue. Negative for fever.  Respiratory: Negative for shortness of breath.   Cardiovascular: Negative for chest pain and palpitations.  Gastrointestinal: Negative for abdominal pain and nausea.  Genitourinary: Positive for menstrual problem. Negative for dysuria.  Musculoskeletal: Positive for back pain. Negative for arthralgias.  Skin:  Negative for rash.  Neurological: Positive for headaches.  Psychiatric/Behavioral: Negative for dysphoric mood. The patient is nervous/anxious (Controlled).        Objective:   Vitals:   01/01/20 1008  BP: 108/70  Pulse: (!) 102  Temp: 98 F (36.7 C)  SpO2: 96%   BP Readings from Last 3 Encounters:  01/01/20 108/70  12/11/19 104/78  02/26/19 112/78   Wt Readings from Last 3 Encounters:  01/01/20 185 lb (83.9 kg)  12/11/19 191 lb (86.6 kg)  02/26/19 189 lb (85.7 kg)   Body mass index is 28.13 kg/m.   Physical Exam    Constitutional: Appears well-developed and well-nourished. No distress.  Head: Normocephalic and atraumatic.  Neck: Neck supple. No tracheal deviation present. No thyromegaly present.  No cervical  lymphadenopathy Cardiovascular: Normal rate, regular rhythm and normal heart sounds.  No murmur heard. No carotid bruit .  No edema Pulmonary/Chest: Effort normal and breath sounds normal. No respiratory distress. No has no wheezes. No rales.  Skin: Skin is warm and dry. Not diaphoretic.  Psychiatric: Normal mood and affect. Behavior is normal.       Assessment & Plan:    See Problem List for Assessment and Plan of chronic medical problems.    This visit occurred during the SARS-CoV-2 public health emergency.  Safety protocols were in place, including screening questions prior to the visit, additional usage of staff PPE, and extensive cleaning of exam room while observing appropriate contact time as indicated for disinfecting solutions.

## 2019-12-31 NOTE — Patient Instructions (Addendum)
  Blood work was ordered.      Medications reviewed and updated.  Changes include :   none      

## 2020-01-01 ENCOUNTER — Ambulatory Visit: Payer: No Typology Code available for payment source | Admitting: Internal Medicine

## 2020-01-01 ENCOUNTER — Other Ambulatory Visit: Payer: Self-pay

## 2020-01-01 ENCOUNTER — Encounter: Payer: Self-pay | Admitting: Internal Medicine

## 2020-01-01 VITALS — BP 108/70 | HR 102 | Temp 98.0°F | Ht 68.0 in | Wt 185.0 lb

## 2020-01-01 DIAGNOSIS — R5383 Other fatigue: Secondary | ICD-10-CM

## 2020-01-01 DIAGNOSIS — N920 Excessive and frequent menstruation with regular cycle: Secondary | ICD-10-CM

## 2020-01-01 LAB — COMPREHENSIVE METABOLIC PANEL
ALT: 23 U/L (ref 0–35)
AST: 28 U/L (ref 0–37)
Albumin: 4.7 g/dL (ref 3.5–5.2)
Alkaline Phosphatase: 80 U/L (ref 39–117)
BUN: 13 mg/dL (ref 6–23)
CO2: 28 mEq/L (ref 19–32)
Calcium: 9.7 mg/dL (ref 8.4–10.5)
Chloride: 102 mEq/L (ref 96–112)
Creatinine, Ser: 0.95 mg/dL (ref 0.40–1.20)
GFR: 84.89 mL/min (ref >60.00)
Glucose, Bld: 79 mg/dL (ref 70–99)
Potassium: 4 mEq/L (ref 3.5–5.1)
Sodium: 138 mEq/L (ref 135–145)
Total Bilirubin: 0.4 mg/dL (ref 0.2–1.2)
Total Protein: 7.6 g/dL (ref 6.0–8.3)

## 2020-01-01 LAB — CBC WITH DIFFERENTIAL/PLATELET
Basophils Absolute: 0 10*3/uL (ref 0.0–0.1)
Basophils Relative: 0.6 % (ref 0.0–3.0)
Eosinophils Absolute: 0.1 10*3/uL (ref 0.0–0.7)
Eosinophils Relative: 1.6 % (ref 0.0–5.0)
HCT: 42.8 % (ref 36.0–46.0)
Hemoglobin: 14.3 g/dL (ref 12.0–15.0)
Lymphocytes Relative: 21.5 % (ref 12.0–46.0)
Lymphs Abs: 1.5 10*3/uL (ref 0.7–4.0)
MCHC: 33.5 g/dL (ref 30.0–36.0)
MCV: 83.6 fl (ref 78.0–100.0)
Monocytes Absolute: 0.8 10*3/uL (ref 0.1–1.0)
Monocytes Relative: 11.3 % (ref 3.0–12.0)
Neutro Abs: 4.6 10*3/uL (ref 1.4–7.7)
Neutrophils Relative %: 65 % (ref 43.0–77.0)
Platelets: 311 10*3/uL (ref 150.0–400.0)
RBC: 5.11 Mil/uL (ref 3.87–5.11)
RDW: 12.5 % (ref 11.5–15.5)
WBC: 7 10*3/uL (ref 4.0–10.5)

## 2020-01-01 LAB — TSH: TSH: 1.23 u[IU]/mL (ref 0.35–4.50)

## 2020-01-01 NOTE — Assessment & Plan Note (Signed)
Acute Concerned this may be related to heavy menses, anemia and possible iron deficiency Her mother has iron deficiency and she is a having heavy periods although regular Still tired after getting a good amount of sleep No other concerning symptoms CBC, CMP, iron panel, TSH

## 2020-01-01 NOTE — Assessment & Plan Note (Signed)
Acute Problems with gynecology, but has not seen them recently Not currently on birth control Menses regular, but heavy for the first couple of days Given her fatigue concern for anemia, low iron CBC, iron panel and TSH

## 2020-01-02 LAB — IRON,TIBC AND FERRITIN PANEL
%SAT: 12 % (calc) — ABNORMAL LOW (ref 16–45)
Ferritin: 111 ng/mL (ref 16–154)
Iron: 38 ug/dL — ABNORMAL LOW (ref 40–190)
TIBC: 327 mcg/dL (calc) (ref 250–450)

## 2020-01-09 ENCOUNTER — Encounter: Payer: Self-pay | Admitting: Internal Medicine

## 2020-03-01 ENCOUNTER — Emergency Department (HOSPITAL_COMMUNITY)
Admission: EM | Admit: 2020-03-01 | Discharge: 2020-03-02 | Disposition: A | Discharge status: Home / Self Care | Payer: No Typology Code available for payment source | Attending: Emergency Medicine | Admitting: Emergency Medicine

## 2020-03-01 ENCOUNTER — Emergency Department (HOSPITAL_COMMUNITY): Payer: No Typology Code available for payment source

## 2020-03-01 ENCOUNTER — Encounter (HOSPITAL_COMMUNITY): Payer: Self-pay

## 2020-03-01 ENCOUNTER — Other Ambulatory Visit: Payer: Self-pay

## 2020-03-01 DIAGNOSIS — Z5321 Procedure and treatment not carried out due to patient leaving prior to being seen by health care provider: Secondary | ICD-10-CM | POA: Insufficient documentation

## 2020-03-01 DIAGNOSIS — R0789 Other chest pain: Secondary | ICD-10-CM | POA: Insufficient documentation

## 2020-03-01 DIAGNOSIS — R0602 Shortness of breath: Secondary | ICD-10-CM | POA: Diagnosis not present

## 2020-03-01 DIAGNOSIS — Z20822 Contact with and (suspected) exposure to covid-19: Secondary | ICD-10-CM | POA: Diagnosis not present

## 2020-03-01 LAB — BASIC METABOLIC PANEL
Anion gap: 12 (ref 5–15)
BUN: 16 mg/dL (ref 6–20)
CO2: 22 mmol/L (ref 22–32)
Calcium: 9.7 mg/dL (ref 8.9–10.3)
Chloride: 103 mmol/L (ref 98–111)
Creatinine, Ser: 0.9 mg/dL (ref 0.44–1.00)
GFR, Estimated: >60 mL/min (ref >60)
Glucose, Bld: 94 mg/dL (ref 70–99)
Potassium: 3.9 mmol/L (ref 3.5–5.1)
Sodium: 137 mmol/L (ref 135–145)

## 2020-03-01 LAB — CBC
HCT: 42.6 % (ref 36.0–46.0)
Hemoglobin: 14 g/dL (ref 12.0–15.0)
MCH: 28.1 pg (ref 26.0–34.0)
MCHC: 32.9 g/dL (ref 30.0–36.0)
MCV: 85.5 fL (ref 80.0–100.0)
Platelets: 325 10*3/uL (ref 150–400)
RBC: 4.98 MIL/uL (ref 3.87–5.11)
RDW: 11.9 % (ref 11.5–15.5)
WBC: 10 10*3/uL (ref 4.0–10.5)
nRBC: 0 % (ref 0.0–0.2)

## 2020-03-01 LAB — RESP PANEL BY RT-PCR (FLU A&B, COVID) ARPGX2
Influenza A by PCR: NEGATIVE
Influenza B by PCR: NEGATIVE
SARS Coronavirus 2 by RT PCR: NEGATIVE

## 2020-03-01 LAB — I-STAT BETA HCG BLOOD, ED (MC, WL, AP ONLY): I-stat hCG, quantitative: <5 m[IU]/mL (ref <5)

## 2020-03-01 LAB — TROPONIN I (HIGH SENSITIVITY): Troponin I (High Sensitivity): <2 ng/L (ref <18)

## 2020-03-01 NOTE — ED Triage Notes (Signed)
Patient reports shortness of breath and chest pain starting this morning, denies cardiac hx, denies any sick contacts, midsternal pressure in nature, denies that the pain radiates.

## 2020-03-02 LAB — TROPONIN I (HIGH SENSITIVITY): Troponin I (High Sensitivity): <2 ng/L (ref <18)

## 2020-03-02 NOTE — ED Notes (Signed)
Patient stated she was leaving. 

## 2020-03-03 NOTE — Progress Notes (Signed)
Subjective:    Patient ID: Marissa [name], female    DOB: 1995/06/10, 25 y.o.   MRN: [id number]  HPI The patient is here for follow up from the ED.  She went to the ED on 1/3.  She was complaining of chest pain and SOB for one day.  She woke up in the morning and her chest felt heavy.  She took advil.  It felt worse and felt like an elephant was sitting on her chest.  She was sitting and she has difficulty breathing.  She had difficulty moving, nausea and was lightheaded.    She had an EKG, CXR, BMP, CBC, troponin, which were all normal.  A urine preg test neg and covid test / flu test were negative.  She left w/o being seen due to the long wait .   She woke up Tuesday and felt much better.  She still had pain with certain movements - achy.  Wednesday it was better.  Today she woke up and is fine.  She feels normal. She took advil a couple of times, but not in the past couple of days.    She has had chest pain in the past but not this bad.  It was muscular chest pain. She denies any unusual activities.  She is taking her prozac daily.  She thinks she does have some anxiety that is not controlled.  She denies depression.    Medications and allergies reviewed with patient and updated if appropriate.  Patient Active Problem List   Diagnosis Date Noted  . Menorrhagia with regular cycle 01/01/2020  . Fatigue 12/31/2019  . Disorder of SI (sacroiliac) joint 12/11/2019  . Knee pain 09/13/2018  . Anxiety 12/21/2015    Current Outpatient Medications on File Prior to Visit  Medication Sig Dispense Refill  . FLUoxetine (PROZAC) 20 MG tablet Take 1 tablet (20 mg total) by mouth daily. 90 tablet 3   No current facility-administered medications on file prior to visit.    Past Medical History:  Diagnosis Date  . Allergy     Past Surgical History:  Procedure Laterality Date  . NO PAST SURGERIES      Social History   Socioeconomic History  . Marital status: Single    Spouse  name: Not on file  . Number of children: Not on file  . Years of education: Not on file  . Highest education level: Not on file  Occupational History  . Not on file  Tobacco Use  . Smoking status: Never Smoker  . Smokeless tobacco: Never Used  Substance and Sexual Activity  . Alcohol use: No  . Drug use: No  . Sexual activity: Not on file  Other Topics Concern  . Not on file  Social History Narrative  . Not on file   Social Determinants of Health   Financial Resource Strain: Not on file  Food Insecurity: Not on file  Transportation Needs: Not on file  Physical Activity: Not on file  Stress: Not on file  Social Connections: Not on file    Family History  Problem Relation Age of Onset  . Heart disease Father   . Renal cancer Maternal Grandfather   . Cancer Maternal Grandfather        kidney   Last couple of days -  Review of Systems  Constitutional: Negative for fever.  Respiratory: Negative for cough, shortness of breath and wheezing.   Cardiovascular: Negative for chest pain, palpitations and leg swelling.  Gastrointestinal: Negative for nausea.  Musculoskeletal: Negative for myalgias.  Neurological: Negative for dizziness, light-headedness and headaches.  Psychiatric/Behavioral: Negative for dysphoric mood. The patient is nervous/anxious.        Objective:   Vitals:   03/04/20 1011  BP: 102/72  Pulse: 84  Temp: 98.3 F (36.8 C)  SpO2: 97%   BP Readings from Last 3 Encounters:  03/04/20 102/72  03/02/20 (!) 118/95  01/01/20 108/70   Wt Readings from Last 3 Encounters:  03/04/20 185 lb (83.9 kg)  03/01/20 180 lb (81.6 kg)  01/01/20 185 lb (83.9 kg)   Body mass index is 27.32 kg/m.   Physical Exam    Constitutional: Appears well-developed and well-nourished. No distress.  HENT:  Head: Normocephalic and atraumatic.  Neck: Neck supple. No tracheal deviation present. No thyromegaly present.  No cervical lymphadenopathy Cardiovascular: Normal  rate, regular rhythm and normal heart sounds.   No murmur heard. No carotid bruit .  No edema Pulmonary/Chest: chest wall non-tender.  Effort normal and breath sounds normal. No respiratory distress. No has no wheezes. No rales.  Skin: Skin is warm and dry. Not diaphoretic.  Psychiatric: Normal mood and affect. Behavior is normal.      Assessment & Plan:    See Problem List for Assessment and Plan of chronic medical problems.    This visit occurred during the SARS-CoV-2 public health emergency.  Safety protocols were in place, including screening questions prior to the visit, additional usage of staff PPE, and extensive cleaning of exam room while observing appropriate contact time as indicated for disinfecting solutions.

## 2020-03-04 ENCOUNTER — Other Ambulatory Visit: Payer: Self-pay

## 2020-03-04 ENCOUNTER — Ambulatory Visit: Payer: No Typology Code available for payment source | Admitting: Internal Medicine

## 2020-03-04 ENCOUNTER — Encounter: Payer: Self-pay | Admitting: Internal Medicine

## 2020-03-04 VITALS — BP 102/72 | HR 84 | Temp 98.3°F | Ht 69.0 in | Wt 185.0 lb

## 2020-03-04 DIAGNOSIS — R0789 Other chest pain: Secondary | ICD-10-CM

## 2020-03-04 DIAGNOSIS — R079 Chest pain, unspecified: Secondary | ICD-10-CM | POA: Insufficient documentation

## 2020-03-04 DIAGNOSIS — F419 Anxiety disorder, unspecified: Secondary | ICD-10-CM

## 2020-03-04 MED ORDER — FLUOXETINE HCL 40 MG PO CAPS: 40.0000 mg | ORAL_CAPSULE | Freq: Every day | ORAL | 1 refills | Status: DC

## 2020-03-04 NOTE — Patient Instructions (Addendum)
°  Medications changes include :  Increase prozac to 40 mg daily   Your prescription(s) have been submitted to your pharmacy. Please take as directed and contact our office if you believe you are having problem(s) with the medication(s).

## 2020-03-04 NOTE — Assessment & Plan Note (Signed)
Chronic Not controlled  Continue prozac, but increase from 20 mg to 40 mg

## 2020-03-04 NOTE — Assessment & Plan Note (Signed)
Acute Reviewed ED work up which was normal - reviewed this with her Her pain has resolved Has had other episodes - this was the worst episode After discussing with her - this was likely related to underlying anxiety Will increase prozac to 40 mg daily

## 2020-05-04 ENCOUNTER — Encounter: Payer: Self-pay | Admitting: Internal Medicine

## 2020-05-04 DIAGNOSIS — M545 Low back pain, unspecified: Secondary | ICD-10-CM

## 2020-05-12 ENCOUNTER — Ambulatory Visit: Payer: No Typology Code available for payment source | Admitting: Surgery

## 2020-05-12 ENCOUNTER — Ambulatory Visit (INDEPENDENT_AMBULATORY_CARE_PROVIDER_SITE_OTHER): Payer: No Typology Code available for payment source

## 2020-05-12 ENCOUNTER — Encounter: Payer: Self-pay | Admitting: Surgery

## 2020-05-12 ENCOUNTER — Ambulatory Visit: Payer: Self-pay

## 2020-05-12 ENCOUNTER — Other Ambulatory Visit: Payer: Self-pay

## 2020-05-12 VITALS — BP 102/70 | HR 76 | Ht 69.0 in | Wt 185.0 lb

## 2020-05-12 DIAGNOSIS — M5442 Lumbago with sciatica, left side: Secondary | ICD-10-CM

## 2020-05-12 DIAGNOSIS — G8929 Other chronic pain: Secondary | ICD-10-CM

## 2020-05-12 DIAGNOSIS — M533 Sacrococcygeal disorders, not elsewhere classified: Secondary | ICD-10-CM | POA: Diagnosis not present

## 2020-05-12 NOTE — Progress Notes (Signed)
Office Visit Note   Patient: Marissa [name]           Date of Birth: 1995/06/27           MRN: [id number] Visit Date: 05/12/2020              Requested by: Pincus Sanes, MD [address] [place],  [place] [address] PCP: Pincus Sanes, MD   Assessment & Plan: Visit Diagnoses:  1. Acute left-sided low back pain with left-sided sciatica   2. Chronic left SI joint pain     Plan: Since patient has had some improvement over the last week I recommended conservative treatment with over-the-counter ibuprofen as directed.  She can also continue using heat or ice as needed.  Will refer patient to formal PT here in our office and they can evaluate and treat, instruct home exercise program and do modalities as needed.  Patient will follow with me in 2 weeks for recheck.  If she has a flareup of her symptoms before next appointment she will call and let me know and I will plan to send in a prednisone taper.  I do not think prednisone taper is indicated at this point.  Depending on how she is feeling at follow-up I may also consider getting blood work to check a CBC and arthritis panel.  Patient also may need lumbar MRI at some point.  X-ray does show some to space narrowing at L5-S1 and question of a left-sided pars defect at this level.  Follow-Up Instructions: Return in about 2 weeks (around 05/26/2020) for with Ling Flesch recheck low back pain and left si joint pain.   Orders:  Orders Placed This Encounter  Procedures  . XR Lumbar Spine 2-3 Views  . Ambulatory referral to Physical Therapy   No orders of the defined types were placed in this encounter.     Procedures: No procedures performed   Clinical Data: No additional findings.   Subjective: Chief Complaint  Patient presents with  . Lower Back - Pain    HPI 25 year old white female who is new patient to clinic is referred by primary care physician Dr. Cheryll Cockayne for left SI joint pain.  Patient states that she has had  off-and-on back issues for several years after having been very active/competing in gymnastics, cheer, dance and soccer for at least 15 years of her childhood.  About a year ago she began having more pain localized around the left SI joint, left buttock and has had episodes of pain radiating down to just above her left knee.  Has also had some episodes of slight numbness and tingling in her thigh.  She has discussed this problem with her primary care physician.  Patient has been trying to manage this conservatively with oral NSAIDs, stretching program, heat and activity modification.  She contacted her primary care physician May 04, 2020 with increased flare of her symptoms that have been going on x3 weeks.  Over the last week she has had some improvement and has been more functional but the recent flare she had difficulty ambulating, standing and this was waking her up at night.  Patient is employed as a Psychologist, sport and exercise at [employer]  She is not sure of a family history of inflammatory arthropathy.  She thinks that her dad may have gout but not sure. Review of Systems No current cardiac pulmonary GI GU issues  Objective: Vital Signs: BP 102/70   Pulse 76   Ht  5\' 9"  (1.753 m)   Wt 185 lb (83.9 kg)   BMI 27.32 kg/m   Physical Exam Constitutional:      Appearance: Normal appearance.  HENT:     Head: Normocephalic.  Eyes:     Extraocular Movements: Extraocular movements intact.  Pulmonary:     Effort: No respiratory distress.  Musculoskeletal:     Comments: Gait is normal.  Full lumbar flexion with hands to floor without discomfort.  Mild discomfort with lumbar extension..  Patient has mild tenderness over the L4 and L5 spinous process.  Mild left-sided notch tenderness.  Mild tenderness over the left SI joint.  Right SI joint and sciatic notch nontender.  Negative logroll bilateral hips.  Negative straight leg raise.  Mildly positive left FABER test.  Negative on the right.  Neurovas  intact.  No focal motor deficits.  Neurological:     General: No focal deficit present.     Mental Status: She is alert and oriented to person, place, and time.     Ortho Exam  Specialty Comments:  No specialty comments available.  Imaging: XR Lumbar Spine 2-3 Views  Result Date: 05/12/2020 4 view x-rays lumbar spine obtained today.  Some to space narrowing at L5-S1.  No spondylolisthesis.  On oblique views there is question of a possible L5 left-sided pars defect.    PMFS History: Patient Active Problem List   Diagnosis Date Noted  . Chest pain 03/04/2020  . Menorrhagia with regular cycle 01/01/2020  . Fatigue 12/31/2019  . Disorder of SI (sacroiliac) joint 12/11/2019  . Knee pain 09/13/2018  . Anxiety 12/21/2015   Past Medical History:  Diagnosis Date  . Allergy     Family History  Problem Relation Age of Onset  . Heart disease Father   . Renal cancer Maternal Grandfather   . Cancer Maternal Grandfather        kidney    Past Surgical History:  Procedure Laterality Date  . NO PAST SURGERIES     Social History   Occupational History  . Not on file  Tobacco Use  . Smoking status: Never Smoker  . Smokeless tobacco: Never Used  Substance and Sexual Activity  . Alcohol use: No  . Drug use: No  . Sexual activity: Not on file

## 2020-05-19 ENCOUNTER — Other Ambulatory Visit: Payer: Self-pay | Admitting: Surgery

## 2020-05-19 MED ORDER — METHYLPREDNISOLONE 4 MG PO TABS: ORAL_TABLET | ORAL | 0 refills | Status: DC

## 2020-05-19 NOTE — Progress Notes (Signed)
medro[facility]

## 2020-06-04 ENCOUNTER — Ambulatory Visit (HOSPITAL_COMMUNITY)
Admission: RE | Admit: 2020-06-04 | Discharge: 2020-06-04 | Disposition: A | Discharge status: Home / Self Care | Payer: No Typology Code available for payment source | Source: Ambulatory Visit | Attending: Internal Medicine | Admitting: Internal Medicine

## 2020-06-04 ENCOUNTER — Ambulatory Visit: Payer: No Typology Code available for payment source | Admitting: Internal Medicine

## 2020-06-04 ENCOUNTER — Encounter: Payer: Self-pay | Admitting: Internal Medicine

## 2020-06-04 ENCOUNTER — Other Ambulatory Visit: Payer: Self-pay

## 2020-06-04 ENCOUNTER — Telehealth: Payer: Self-pay | Admitting: *Deleted

## 2020-06-04 VITALS — BP 102/78 | HR 80 | Temp 98.3°F | Ht 69.0 in | Wt 181.6 lb

## 2020-06-04 DIAGNOSIS — M79605 Pain in left leg: Secondary | ICD-10-CM | POA: Insufficient documentation

## 2020-06-04 NOTE — Patient Instructions (Addendum)
Have the Ultrasound today.    Apply heat to the leg.  Take tylenol or advil as needed.

## 2020-06-04 NOTE — Progress Notes (Signed)
Subjective:    Patient ID: Marissa [name], female    DOB: November 18, 1995, 25 y.o.   MRN: [id number]  HPI The patient is here for an acute visit.  1 week ago she did a handstand and attempt to stop her hiccups, which was successful, but when she brought her legs back down she hit her left lower leg on a pillar.  She hit in the anterior-lateral aspect of the mid lower leg.  She did not hit directly on the bone.  She has bruising in that area that has moved down to her ankle over the past couple of days.  Bruising has changed from more purple to yellow.  She has pain in that area and in the calf.  It is painful to walk.  She has been doing her usual activity since then.  Yesterday she worked 12 hours and was on her feet most of the time.  The pain is slightly better today compared to yesterday.  There has been a lump in the area where she hit the leg.   Medications and allergies reviewed with patient and updated if appropriate.  Patient Active Problem List   Diagnosis Date Noted  . Chest pain 03/04/2020  . Menorrhagia with regular cycle 01/01/2020  . Fatigue 12/31/2019  . Disorder of SI (sacroiliac) joint 12/11/2019  . Knee pain 09/13/2018  . Anxiety 12/21/2015    Current Outpatient Medications on File Prior to Visit  Medication Sig Dispense Refill  . FLUoxetine (PROZAC) 40 MG capsule Take 1 capsule (40 mg total) by mouth daily. 90 capsule 1   No current facility-administered medications on file prior to visit.    Past Medical History:  Diagnosis Date  . Allergy     Past Surgical History:  Procedure Laterality Date  . NO PAST SURGERIES      Social History   Socioeconomic History  . Marital status: Single    Spouse name: Not on file  . Number of children: Not on file  . Years of education: Not on file  . Highest education level: Not on file  Occupational History  . Not on file  Tobacco Use  . Smoking status: Never Smoker  . Smokeless tobacco: Never Used  Substance  and Sexual Activity  . Alcohol use: No  . Drug use: No  . Sexual activity: Not on file  Other Topics Concern  . Not on file  Social History Narrative  . Not on file   Social Determinants of Health   Financial Resource Strain: Not on file  Food Insecurity: Not on file  Transportation Needs: Not on file  Physical Activity: Not on file  Stress: Not on file  Social Connections: Not on file    Family History  Problem Relation Age of Onset  . Heart disease Father   . Renal cancer Maternal Grandfather   . Cancer Maternal Grandfather        kidney    Review of Systems Per HPI    Objective:   Vitals:   06/04/20 1422  BP: 102/78  Pulse: 80  Temp: 98.3 F (36.8 C)  SpO2: 96%   BP Readings from Last 3 Encounters:  06/04/20 102/78  05/12/20 102/70  03/04/20 102/72   Wt Readings from Last 3 Encounters:  06/04/20 181 lb 9.6 oz (82.4 kg)  05/12/20 185 lb (83.9 kg)  03/04/20 185 lb (83.9 kg)   Body mass index is 26.82 kg/m.   Physical Exam Constitutional:  General: She is not in acute distress.    Appearance: Normal appearance. She is not ill-appearing.  HENT:     Head: Normocephalic and atraumatic.  Musculoskeletal:     Comments: Left lower leg-mid lower leg anterior-lateral aspect there is a palpable hematoma that is tender to palpation.  Surrounding bruising that is yellow in color with slight yellow and purple discoloration around malleolus.  Full range of motion of ankle without tenderness.  No tenderness of ankle.  Tenderness left mid lower leg where bruising is.  Negative Homans' sign.  No calf tenderness with palpation.  Skin:    General: Skin is warm and dry.  Neurological:     Mental Status: She is alert.            Assessment & Plan:    See Problem List for Assessment and Plan of chronic medical problems.    This visit occurred during the SARS-CoV-2 public health emergency.  Safety protocols were in place, including screening questions  prior to the visit, additional usage of staff PPE, and extensive cleaning of exam room while observing appropriate contact time as indicated for disinfecting solutions.

## 2020-06-04 NOTE — Assessment & Plan Note (Signed)
Acute Pain and bruising in left lower extremity after hitting it 1 week ago Pain is likely related to hematoma, bruising and possible muscle contusion Low likelihood of DVT, but it is a possibility so we will go ahead and get an ultrasound of her leg to rule out a clot Advised applying heat, elevating the leg when sitting, but continue normal activities and taking Tylenol or ibuprofen for discomfort Advised that this will slowly improve, but could take weeks because of the hematoma

## 2020-06-04 NOTE — Telephone Encounter (Signed)
Negative for DVT.Marland Kitchen will let the pt leave.Marland KitchenRaechel Chute

## 2020-06-07 ENCOUNTER — Other Ambulatory Visit: Payer: Self-pay

## 2020-06-07 ENCOUNTER — Ambulatory Visit (INDEPENDENT_AMBULATORY_CARE_PROVIDER_SITE_OTHER): Payer: No Typology Code available for payment source | Admitting: Rehabilitative and Restorative Service Providers"

## 2020-06-07 DIAGNOSIS — M6281 Muscle weakness (generalized): Secondary | ICD-10-CM | POA: Diagnosis not present

## 2020-06-07 DIAGNOSIS — M545 Low back pain, unspecified: Secondary | ICD-10-CM

## 2020-06-07 NOTE — Therapy (Signed)
[facility] Physical Therapy [address] [place], [place], [address] Phone: [phone]   Fax:  [phone]  Physical Therapy Evaluation  Patient Details  Name: Marissa [name] MRN: [id number] Date of Birth: November 02, 1995 Referring Provider (PT): Zonia Kief PA-C   Encounter Date: 06/07/2020   PT End of Session - 06/07/20 1345    Visit Number 1    Number of Visits 8    Date for PT Re-Evaluation 08/02/20    PT Start Time 1345    PT Stop Time 1424    PT Time Calculation (min) 39 min    Activity Tolerance Patient tolerated treatment well    Behavior During Therapy [facility] for tasks assessed/performed           Past Medical History:  Diagnosis Date  . Allergy     Past Surgical History:  Procedure Laterality Date  . NO PAST SURGERIES      There were no vitals filed for this visit.    Subjective Assessment - 06/07/20 1352    Subjective Pt. stated complaints of back pain into Lt hip posteriorly, noted a few years ago.  Sharp pain that can also go into Lt LE at times.  When symptoms are worse, walking is more troublesome c weight on heel.  Pt. stated pain comes and goes over time.  Pt. stated insidous return of symptoms about 6 weeks ago and lasted worse for about 3 weeks.    Limitations Walking;Standing;House hold activities;Lifting;Sitting    Currently in Pain? No/denies    Pain Score 0-No pain   10/10 at worst   Pain Location Back    Pain Orientation Lower    Pain Descriptors / Indicators Shooting;Sharp;Squeezing    Pain Type --   acute on chronic   Pain Radiating Towards Lt posterior buttock into Lt LE    Aggravating Factors  weight on heel, constant pain when symptoms were worsened.    Pain Relieving Factors nothing seemed to help, just some time.              [facility] PT Assessment - 06/07/20 0001      Assessment   Medical Diagnosis Low back pain, SI joint    Referring Provider (PT) Zonia Kief PA-C    Onset Date/Surgical Date 04/27/20    Hand  Dominance Right      Precautions   Precautions None      Balance Screen   Has the patient fallen in the past 6 months No    Has the patient had a decrease in activity level because of a fear of falling?  No    Is the patient reluctant to leave their home because of a fear of falling?  No      Home [employer]ist information centre manager residence    Additional Comments 2 story house with steps to bedroom      Prior Function   Level of Independence Independent    Vocation Requirements Works at [employer] with long shifts on feet    Leisure Mixed cardio, ab, weight workouts at gym (not doing in last 2-3 months)      Cognition   Overall Cognitive Status Within Functional Limits for tasks assessed      Observation/Other Assessments   Focus on Therapeutic Outcomes (FOTO)  intake 60      Posture/Postural Control   Posture Comments Unremarkable in standing      ROM / Strength   AROM / PROM / Strength Strength;PROM;AROM  AROM   AROM Assessment Site Lumbar    Lumbar Flexion to floor s symptoms    Lumbar Extension 100% s symptoms    Lumbar - Right Side Bend to fibular head no symptoms    Lumbar - Left Side Bend to fibular head, no symptoms      PROM   Overall PROM Comments No complaints c IR/ER, flexion c overpressure for bilateral hip in supine.  No restriction in movements noted    PROM Assessment Site Lumbar      Strength   Strength Assessment Site Hip;Knee;Ankle;Lumbar    Right/Left Hip Left;Right    Right Hip Flexion 5/5    Right Hip Extension 5/5    Left Hip Flexion 5/5    Left Hip Extension 5/5    Right/Left Knee Left;Right    Right Knee Flexion 5/5    Right Knee Extension 5/5    Left Knee Flexion 5/5    Left Knee Extension 5/5    Right/Left Ankle Left;Right    Right Ankle Dorsiflexion 5/5    Left Ankle Dorsiflexion 5/5    Lumbar Flexion 4/5      Flexibility   Soft Tissue Assessment /Muscle Length yes    Quadriceps (-) Thomas test bilateral      Palpation    Spinal mobility normal to slight hypermobiity within L1-L5 cPA assessment    SI assessment  Sacral thrust PA produced mild discomfort in region    Palpation comment no specific tenderness reported.      Special Tests    Special Tests Lumbar;Sacrolliac Tests    Lumbar Tests FABER test;Slump Test;Straight Leg Raise    Sacroiliac Tests  Gaenslen's Test      FABER test   findings Negative    Comment bilateral      Slump test   Findings Negative    Comment bilateral      Straight Leg Raise   Findings Negative    Comment > 90 deg bilateral c no back or radicular complaints      Sacral thrust    Findings Negative      Gaenslen's test   Findings Negative                      Objective measurements completed on examination: See above findings.       [facility] Adult PT Treatment/Exercise - 06/07/20 0001      Exercises   Exercises Other Exercises;Lumbar    Other Exercises  HEP instruction/performance c cues for techniques, handout provided.  Trial set performed of each for comprehension and symptom assessment.  HEP consisted of supine figure 4 bridge bilateral, prone plank to fatigue, qruped birddog reaches, supine dying bug c knee bend to extension movement, regional rotation lumbar stretching                  PT Education - 06/07/20 1429    Education Details HEP, POC    Person(s) Educated Patient    Methods Explanation;Demonstration;Verbal cues;Handout    Comprehension Returned demonstration;Verbalized understanding            PT Short Term Goals - 06/07/20 1341      PT SHORT TERM GOAL #1   Title Patient will demonstrate independent use of home exercise program to maintain progress from in clinic treatments.    Time 3    Period Weeks    Status New    Target Date 06/28/20  PT Long Term Goals - 06/07/20 1341      PT LONG TERM GOAL #1   Title Patient will demonstrate/report pain at worst less than or equal to 2/10 to facilitate  minimal limitation in daily activity secondary to pain symptoms.    Time 8    Period Weeks    Status New    Target Date 08/02/20      PT LONG TERM GOAL #2   Title Patient will demonstrate independent use of home exercise program to facilitate ability to maintain/progress functional gains from skilled physical therapy services.    Time 8    Period Weeks    Status New    Target Date 08/02/20      PT LONG TERM GOAL #3   Title Pt. will demonstrate lumbar flexion MMT 5/5 throughout s symptoms to facilitate improved lumbar stability in daily activity.    Time 8    Period Weeks    Status New    Target Date 08/02/20      PT LONG TERM GOAL #4   Title Pt. will demonstrate FOTO outcome > or = 74 to indicated reduced disability due to condition.    Time 8    Period Weeks    Status New    Target Date 08/02/20      PT LONG TERM GOAL #5   Title Pt. will demonstrate/report return to usual work and exercise at PLOF s symptoms.    Time 8    Period Weeks    Status New    Target Date 08/02/20                  Plan - 06/07/20 1344    Clinical Impression Statement Patient is a 25 y.o. who comes to clinic with complaints of low back pain with movement coordination deficits that impair their ability to perform usual daily and recreational functional activities without increase difficulty/symptoms at this time.  Patient to benefit from skilled PT services to address impairments and limitations to improve to previous level of function without restriction secondary to condition.    Examination-Activity Limitations Locomotion Level;Stand;Carry;Bend;Squat;Sit   during pain exacerbation times   Examination-Participation Restrictions Community Activity;Occupation;Other   workouts   Stability/Clinical Decision Making Stable/Uncomplicated    Clinical Decision Making Low    Rehab Potential Good    PT Frequency 1x / week    PT Duration 8 weeks    PT Treatment/Interventions ADLs/Self Care Home  Management;Cryotherapy;Electrical Stimulation;Iontophoresis 4mg /ml Dexamethasone;Moist Heat;Traction;Balance training;Therapeutic exercise;Therapeutic activities;Functional mobility training;Stair training;Gait training;DME Instruction;Ultrasound;Neuromuscular re-education;Patient/family education;Passive range of motion;Spinal Manipulations;Joint Manipulations;Dry needling;Taping;Manual techniques    PT Next Visit Plan Review HEP, evalute for possible d/c if still better.    PT Home Exercise [email or web address]    Consulted and Agree with Plan of Care Patient           Patient will benefit from skilled therapeutic intervention in order to improve the following deficits and impairments:  Decreased endurance,Pain,Decreased strength,Decreased activity tolerance,Increased muscle spasms,Decreased coordination,Hypermobility,Impaired perceived functional ability,Postural dysfunction  Visit Diagnosis: Acute bilateral low back pain, unspecified whether sciatica present  Muscle weakness (generalized)     Problem List Patient Active Problem List   Diagnosis Date Noted  . Pain of left lower extremity 06/04/2020  . Chest pain 03/04/2020  . Menorrhagia with regular cycle 01/01/2020  . Fatigue 12/31/2019  . Disorder of SI (sacroiliac) joint 12/11/2019  . Knee pain 09/13/2018  . Anxiety 12/21/2015    Chyrel MassonMichael Anetha Slagel, PT, DPT, OCS, ATC  06/07/20  2:35 PM    [facility] Physical Therapy [address] [place], [place], [address] Phone: [phone]   Fax:  [phone]  Name: Marissa [name] MRN: [id number] Date of Birth: 1995-07-05

## 2020-06-07 NOTE — Patient Instructions (Signed)
Access Code: [facility] URL: [email or web address] Date: 06/07/2020 Prepared by: Chyrel Masson  Exercises Figure 4 Bridge - 1 x daily - 7 x weekly - 3 sets - 10 reps Standard Plank - 1 x daily - 7 x weekly - 3 sets - 10 reps Bird Dog - 1 x daily - 7 x weekly - 1 sets - 15 reps - 3 hold Supine Dead Bug with Leg Extension - 1 x daily - 7 x weekly - 3 sets - 10 reps Sidelying Lumbar Rotation Stretch - 1 x daily - 7 x weekly - 1 sets - 5 reps - 15 hold

## 2020-06-10 ENCOUNTER — Encounter: Payer: No Typology Code available for payment source | Admitting: Internal Medicine

## 2020-06-16 NOTE — Progress Notes (Signed)
Subjective:    Patient ID: Marissa [name], female    DOB: 1995/05/18, 25 y.o.   MRN: [id number]   This visit occurred during the SARS-CoV-2 public health emergency.  Safety protocols were in place, including screening questions prior to the visit, additional usage of staff PPE, and extensive cleaning of exam room while observing appropriate contact time as indicated for disinfecting solutions.    HPI She is here for a physical exam.   She still struggles with fatigue. She is taking vitamins.  She is tired all the time.  Generally her sleep is good - bed 8-10 and up at 6.  No regular exercise.   She currently works 3 days a week at [employer] for 12 hrs each, is taking 2 classes and studying for the GRE.  Medications and allergies reviewed with patient and updated if appropriate.  Patient Active Problem List   Diagnosis Date Noted  . Pain of left lower extremity 06/04/2020  . Chest pain 03/04/2020  . Menorrhagia with regular cycle 01/01/2020  . Fatigue 12/31/2019  . Disorder of SI (sacroiliac) joint 12/11/2019  . Knee pain 09/13/2018  . Anxiety 12/21/2015    Current Outpatient Medications on File Prior to Visit  Medication Sig Dispense Refill  . FLUoxetine (PROZAC) 40 MG capsule Take 1 capsule (40 mg total) by mouth daily. 90 capsule 1   No current facility-administered medications on file prior to visit.    Past Medical History:  Diagnosis Date  . Allergy     Past Surgical History:  Procedure Laterality Date  . NO PAST SURGERIES      Social History   Socioeconomic History  . Marital status: Single    Spouse name: Not on file  . Number of children: Not on file  . Years of education: Not on file  . Highest education level: Not on file  Occupational History  . Not on file  Tobacco Use  . Smoking status: Never Smoker  . Smokeless tobacco: Never Used  Substance and Sexual Activity  . Alcohol use: No  . Drug use: No  . Sexual activity: Not on file  Other Topics  Concern  . Not on file  Social History Narrative  . Not on file   Social Determinants of Health   Financial Resource Strain: Not on file  Food Insecurity: Not on file  Transportation Needs: Not on file  Physical Activity: Not on file  Stress: Not on file  Social Connections: Not on file    Family History  Problem Relation Age of Onset  . Heart disease Father   . Renal cancer Maternal Grandfather   . Cancer Maternal Grandfather        kidney    Review of Systems  Constitutional: Positive for fatigue. Negative for appetite change, chills and fever.  Eyes: Negative for visual disturbance.  Respiratory: Negative for cough, shortness of breath and wheezing.   Cardiovascular: Positive for leg swelling (if on feet all day). Negative for chest pain and palpitations.  Gastrointestinal: Positive for nausea (every morning). Negative for abdominal pain, blood in stool, constipation and diarrhea.       No gerd  Genitourinary: Positive for menstrual problem (irregular cycles, normal flow). Negative for dysuria.  Musculoskeletal: Positive for arthralgias (knees, hips, ankles - weekly) and back pain (lower back). Negative for joint swelling.  Skin: Negative for color change and rash.  Neurological: Negative for dizziness, light-headedness and headaches.  Psychiatric/Behavioral: Negative for dysphoric mood and sleep disturbance.  The patient is nervous/anxious (controlled).        Objective:   Vitals:   06/17/20 0828  BP: 106/74  Pulse: 99  Temp: 98.4 F (36.9 C)  SpO2: 97%   Filed Weights   06/17/20 0828  Weight: 180 lb 3.2 oz (81.7 kg)   Body mass index is 26.61 kg/m.  BP Readings from Last 3 Encounters:  06/17/20 106/74  06/04/20 102/78  05/12/20 102/70    Wt Readings from Last 3 Encounters:  06/17/20 180 lb 3.2 oz (81.7 kg)  06/04/20 181 lb 9.6 oz (82.4 kg)  05/12/20 185 lb (83.9 kg)     Physical Exam Constitutional: She appears well-developed and well-nourished.  No distress.  HENT:  Head: Normocephalic and atraumatic.  Right Ear: External ear normal. Normal ear canal and TM Left Ear: External ear normal.  Normal ear canal and TM Mouth/Throat: Oropharynx is clear and moist.  Eyes: Conjunctivae and EOM are normal.  Neck: Neck supple. No tracheal deviation present. No thyromegaly present.  No carotid bruit  Cardiovascular: Normal rate, regular rhythm and normal heart sounds.   No murmur heard.  No edema. Pulmonary/Chest: Effort normal and breath sounds normal. No respiratory distress. She has no wheezes. She has no rales.  Breast: deferred   Abdominal: Soft. She exhibits no distension. There is no tenderness.  Lymphadenopathy: She has no cervical adenopathy.  Skin: Skin is warm and dry. She is not diaphoretic.  Psychiatric: She has a normal mood and affect. Her behavior is normal.        Assessment & Plan:   Physical exam: Screening blood work    ordered Immunizations    Up to date  Gyn   Up to date Exercise  none Weight  Mildly overweight Substance abuse  none      See Problem List for Assessment and Plan of chronic medical problems.

## 2020-06-16 NOTE — Patient Instructions (Addendum)
Blood work was ordered.     Medications changes include :   none     Please followup in 1 year    Health Maintenance, Female Adopting a healthy lifestyle and getting preventive care are important in promoting health and wellness. Ask your health care provider about:  The right schedule for you to have regular tests and exams.  Things you can do on your own to prevent diseases and keep yourself healthy. What should I know about diet, weight, and exercise? Eat a healthy diet  Eat a diet that includes plenty of vegetables, fruits, low-fat dairy products, and lean protein.  Do not eat a lot of foods that are high in solid fats, added sugars, or sodium.   Maintain a healthy weight Body mass index (BMI) is used to identify weight problems. It estimates body fat based on height and weight. Your health care provider can help determine your BMI and help you achieve or maintain a healthy weight. Get regular exercise Get regular exercise. This is one of the most important things you can do for your health. Most adults should:  Exercise for at least 150 minutes each week. The exercise should increase your heart rate and make you sweat (moderate-intensity exercise).  Do strengthening exercises at least twice a week. This is in addition to the moderate-intensity exercise.  Spend less time sitting. Even light physical activity can be beneficial. Watch cholesterol and blood lipids Have your blood tested for lipids and cholesterol at 23 years of age, then have this test every 5 years. Have your cholesterol levels checked more often if:  Your lipid or cholesterol levels are high.  You are older than 23 years of age.  You are at high risk for heart disease. What should I know about cancer screening? Depending on your health history and family history, you may need to have cancer screening at various ages. This may include screening for:  Breast cancer.  Cervical cancer.  Colorectal  cancer.  Skin cancer.  Lung cancer. What should I know about heart disease, diabetes, and high blood pressure? Blood pressure and heart disease  High blood pressure causes heart disease and increases the risk of stroke. This is more likely to develop in people who have high blood pressure readings, are of African descent, or are overweight.  Have your blood pressure checked: ? Every 3-5 years if you are 18-39 years of age. ? Every year if you are 40 years old or older. Diabetes Have regular diabetes screenings. This checks your fasting blood sugar level. Have the screening done:  Once every three years after age 40 if you are at a normal weight and have a low risk for diabetes.  More often and at a younger age if you are overweight or have a high risk for diabetes. What should I know about preventing infection? Hepatitis B If you have a higher risk for hepatitis B, you should be screened for this virus. Talk with your health care provider to find out if you are at risk for hepatitis B infection. Hepatitis C Testing is recommended for:  Everyone born from 1945 through 1965.  Anyone with known risk factors for hepatitis C. Sexually transmitted infections (STIs)  Get screened for STIs, including gonorrhea and chlamydia, if: ? You are sexually active and are younger than 24 years of age. ? You are older than 24 years of age and your health care provider tells you that you are at risk for this type of   infection. ? Your sexual activity has changed since you were last screened, and you are at increased risk for chlamydia or gonorrhea. Ask your health care provider if you are at risk.  Ask your health care provider about whether you are at high risk for HIV. Your health care provider may recommend a prescription medicine to help prevent HIV infection. If you choose to take medicine to prevent HIV, you should first get tested for HIV. You should then be tested every 3 months for as long as  you are taking the medicine. Pregnancy  If you are about to stop having your period (premenopausal) and you may become pregnant, seek counseling before you get pregnant.  Take 400 to 800 micrograms (mcg) of folic acid every day if you become pregnant.  Ask for birth control (contraception) if you want to prevent pregnancy. Osteoporosis and menopause Osteoporosis is a disease in which the bones lose minerals and strength with aging. This can result in bone fractures. If you are 65 years old or older, or if you are at risk for osteoporosis and fractures, ask your health care provider if you should:  Be screened for bone loss.  Take a calcium or vitamin D supplement to lower your risk of fractures.  Be given hormone replacement therapy (HRT) to treat symptoms of menopause. Follow these instructions at home: Lifestyle  Do not use any products that contain nicotine or tobacco, such as cigarettes, e-cigarettes, and chewing tobacco. If you need help quitting, ask your health care provider.  Do not use street drugs.  Do not share needles.  Ask your health care provider for help if you need support or information about quitting drugs. Alcohol use  Do not drink alcohol if: ? Your health care provider tells you not to drink. ? You are pregnant, may be pregnant, or are planning to become pregnant.  If you drink alcohol: ? Limit how much you use to 0-1 drink a day. ? Limit intake if you are breastfeeding.  Be aware of how much alcohol is in your drink. In the [place] one drink equals one 12 oz bottle of beer (355 mL), one 5 oz glass of wine (148 mL), or one 1 oz glass of hard liquor (44 mL). General instructions  Schedule regular health, dental, and eye exams.  Stay current with your vaccines.  Tell your health care provider if: ? You often feel depressed. ? You have ever been abused or do not feel safe at home. Summary  Adopting a healthy lifestyle and getting preventive care are  important in promoting health and wellness.  Follow your health care provider's instructions about healthy diet, exercising, and getting tested or screened for diseases.  Follow your health care provider's instructions on monitoring your cholesterol and blood pressure. This information is not intended to replace advice given to you by your health care provider. Make sure you discuss any questions you have with your health care provider. Document Revised: 02/06/2018 Document Reviewed: 02/06/2018 Elsevier Patient Education  2021 [employer]vier Inc.  

## 2020-06-17 ENCOUNTER — Ambulatory Visit (INDEPENDENT_AMBULATORY_CARE_PROVIDER_SITE_OTHER): Payer: No Typology Code available for payment source | Admitting: Internal Medicine

## 2020-06-17 ENCOUNTER — Other Ambulatory Visit: Payer: Self-pay

## 2020-06-17 ENCOUNTER — Encounter: Payer: Self-pay | Admitting: Internal Medicine

## 2020-06-17 VITALS — BP 106/74 | HR 99 | Temp 98.4°F | Ht 69.0 in | Wt 180.2 lb

## 2020-06-17 DIAGNOSIS — E611 Iron deficiency: Secondary | ICD-10-CM

## 2020-06-17 DIAGNOSIS — R5383 Other fatigue: Secondary | ICD-10-CM

## 2020-06-17 DIAGNOSIS — Z0000 Encounter for general adult medical examination without abnormal findings: Secondary | ICD-10-CM | POA: Diagnosis not present

## 2020-06-17 DIAGNOSIS — F419 Anxiety disorder, unspecified: Secondary | ICD-10-CM | POA: Diagnosis not present

## 2020-06-17 LAB — COMPREHENSIVE METABOLIC PANEL
ALT: 23 U/L (ref 0–35)
AST: 21 U/L (ref 0–37)
Albumin: 4.2 g/dL (ref 3.5–5.2)
Alkaline Phosphatase: 72 U/L (ref 39–117)
BUN: 10 mg/dL (ref 6–23)
CO2: 28 mEq/L (ref 19–32)
Calcium: 9.7 mg/dL (ref 8.4–10.5)
Chloride: 106 mEq/L (ref 96–112)
Creatinine, Ser: 0.95 mg/dL (ref 0.40–1.20)
GFR: 84.62 mL/min (ref >60.00)
Glucose, Bld: 81 mg/dL (ref 70–99)
Potassium: 4.3 mEq/L (ref 3.5–5.1)
Sodium: 141 mEq/L (ref 135–145)
Total Bilirubin: 0.6 mg/dL (ref 0.2–1.2)
Total Protein: 7.2 g/dL (ref 6.0–8.3)

## 2020-06-17 LAB — CBC WITH DIFFERENTIAL/PLATELET
Basophils Absolute: 0 10*3/uL (ref 0.0–0.1)
Basophils Relative: 0.5 % (ref 0.0–3.0)
Eosinophils Absolute: 0.1 10*3/uL (ref 0.0–0.7)
Eosinophils Relative: 2.3 % (ref 0.0–5.0)
HCT: 41.2 % (ref 36.0–46.0)
Hemoglobin: 14.1 g/dL (ref 12.0–15.0)
Lymphocytes Relative: 28.2 % (ref 12.0–46.0)
Lymphs Abs: 1.7 10*3/uL (ref 0.7–4.0)
MCHC: 34.2 g/dL (ref 30.0–36.0)
MCV: 83.6 fl (ref 78.0–100.0)
Monocytes Absolute: 0.5 10*3/uL (ref 0.1–1.0)
Monocytes Relative: 7.9 % (ref 3.0–12.0)
Neutro Abs: 3.7 10*3/uL (ref 1.4–7.7)
Neutrophils Relative %: 61.1 % (ref 43.0–77.0)
Platelets: 306 10*3/uL (ref 150.0–400.0)
RBC: 4.93 Mil/uL (ref 3.87–5.11)
RDW: 12.8 % (ref 11.5–15.5)
WBC: 6 10*3/uL (ref 4.0–10.5)

## 2020-06-17 LAB — TSH: TSH: 2.17 u[IU]/mL (ref 0.35–4.50)

## 2020-06-17 LAB — LIPID PANEL
Cholesterol: 205 mg/dL — ABNORMAL HIGH (ref 0–200)
HDL: 44 mg/dL (ref >39.00)
LDL Cholesterol: 144 mg/dL — ABNORMAL HIGH (ref 0–99)
NonHDL: 161.13
Total CHOL/HDL Ratio: 5
Triglycerides: 84 mg/dL (ref 0.0–149.0)
VLDL: 16.8 mg/dL (ref 0.0–40.0)

## 2020-06-17 MED ORDER — MULTIVITAMINS PO CAPS: 1.0000 | ORAL_CAPSULE | Freq: Every day | ORAL | Status: DC

## 2020-06-17 MED ORDER — IRON (FERROUS SULFATE) 325 (65 FE) MG PO TABS: 325.0000 mg | ORAL_TABLET | Freq: Every day | ORAL | 0 refills | Status: DC

## 2020-06-17 NOTE — Assessment & Plan Note (Signed)
Chronic Controlled, stable Continue prozac 40 mg daily 

## 2020-06-17 NOTE — Assessment & Plan Note (Signed)
Chronic Still with fatigue Taking iron and mvi Likely related to not exercising, working 12 hrs, taking 2 classes and studying for GRE Check cbc, cmp, tsh

## 2020-06-17 NOTE — Assessment & Plan Note (Signed)
Chronic Taking iron daily Will check iron, cbc

## 2020-06-18 ENCOUNTER — Encounter: Payer: Self-pay | Admitting: Internal Medicine

## 2020-06-18 LAB — IRON,TIBC AND FERRITIN PANEL
%SAT: 37 % (calc) (ref 16–45)
Ferritin: 92 ng/mL (ref 16–154)
Iron: 115 ug/dL (ref 40–190)
TIBC: 311 mcg/dL (calc) (ref 250–450)

## 2020-06-29 ENCOUNTER — Ambulatory Visit (INDEPENDENT_AMBULATORY_CARE_PROVIDER_SITE_OTHER): Payer: No Typology Code available for payment source | Admitting: Rehabilitative and Restorative Service Providers"

## 2020-06-29 ENCOUNTER — Other Ambulatory Visit: Payer: Self-pay

## 2020-06-29 ENCOUNTER — Encounter: Payer: Self-pay | Admitting: Rehabilitative and Restorative Service Providers"

## 2020-06-29 DIAGNOSIS — M6281 Muscle weakness (generalized): Secondary | ICD-10-CM | POA: Diagnosis not present

## 2020-06-29 DIAGNOSIS — M545 Low back pain, unspecified: Secondary | ICD-10-CM | POA: Diagnosis not present

## 2020-06-29 NOTE — Therapy (Signed)
[facility] [address] [place], [place], [address] Phone: [phone]   Fax:  [phone]  Physical Therapy Treatment/Discharge  Patient Details  Name: Marissa [name] MRN: [id number] Date of Birth: 04-05-1995 Referring Provider (PT): Benjiman Core PA-C   Encounter Date: 06/29/2020   PHYSICAL THERAPY DISCHARGE SUMMARY  Visits from Start of Care: 2  Current functional level related to goals / functional outcomes: See Note   Remaining deficits: See note   Education / Equipment: See note Plan: Patient agrees to discharge.  Patient goals were met. Patient is being discharged due to being pleased with the current functional level.  ?????        PT End of Session - 06/29/20 1313    Visit Number 2    Number of Visits 8    Date for PT Re-Evaluation 08/02/20    PT Start Time 1300    PT Stop Time 1318    PT Time Calculation (min) 18 min    Activity Tolerance Patient tolerated treatment well    Behavior During Therapy WFL for tasks assessed/performed           Past Medical History:  Diagnosis Date  . Allergy     Past Surgical History:  Procedure Laterality Date  . NO PAST SURGERIES      There were no vitals filed for this visit.   Subjective Assessment - 06/29/20 1327    Subjective No back pain indicated c good HEP performance reported.    Currently in Pain? No/denies    Pain Score 0-No pain              [facility] PT Assessment - 06/29/20 0001      Observation/Other Assessments   Focus on Therapeutic Outcomes (FOTO)  intake 94 %      AROM   Overall AROM Comments Lumbar AROM WFL s symptoms.      Strength   Lumbar Flexion 4+/5                         [facility] Adult PT Treatment/Exercise - 06/29/20 0001      Exercises   Other Exercises  Verbal review of existing HEP c addition of side planks, mountain climbers for progression.      Lumbar Exercises: Sidelying   Other Sidelying Lumbar Exercises side  plank to fatigue x 2 each side on knees                  PT Education - 06/29/20 1326    Education Details HEP, Dicharge planning.    Person(s) Educated Patient    Methods Explanation;Demonstration    Comprehension Verbalized understanding;Returned demonstration            PT Short Term Goals - 06/29/20 1326      PT SHORT TERM GOAL #1   Title Patient will demonstrate independent use of home exercise program to maintain progress from in clinic treatments.    Time 3    Period Weeks    Status Achieved    Target Date 06/28/20             PT Long Term Goals - 06/29/20 1326      PT LONG TERM GOAL #1   Title Patient will demonstrate/report pain at worst less than or equal to 2/10 to facilitate minimal limitation in daily activity secondary to pain symptoms.    Time 8    Period Weeks    Status Achieved  PT LONG TERM GOAL #2   Title Patient will demonstrate independent use of home exercise program to facilitate ability to maintain/progress functional gains from skilled physical therapy services.    Time 8    Period Weeks    Status Achieved      PT LONG TERM GOAL #3   Title Pt. will demonstrate lumbar flexion MMT 5/5 throughout s symptoms to facilitate improved lumbar stability in daily activity.    Time 8    Period Weeks    Status Partially Met      PT LONG TERM GOAL #4   Title Pt. will demonstrate FOTO outcome > or = 74 to indicated reduced disability due to condition.    Time 8    Period Weeks    Status Achieved      PT LONG TERM GOAL #5   Title Pt. will demonstrate/report return to usual work and exercise at PLOF s symptoms.    Time 8    Period Weeks    Status Achieved                 Plan - 06/29/20 1325    Clinical Impression Statement Pt. has attended 2 visits overall during course of treatment, reporting no symptoms and good knowledge of HEP.  See objective data for updated information.  Pt. current presents c appropriate improvement and  knowledge of HEP for d/c to HEP c return prn in future.    Examination-Activity Limitations Locomotion Level;Stand;Carry;Bend;Squat;Sit   during pain exacerbation times   Examination-Participation Restrictions Community Activity;Occupation;Other   workouts   Stability/Clinical Decision Making Stable/Uncomplicated    Rehab Potential Good    PT Frequency 1x / week    PT Duration 8 weeks    PT Treatment/Interventions ADLs/Self Care Home Management;Cryotherapy;Electrical Stimulation;Iontophoresis 79m/ml Dexamethasone;Moist Heat;Traction;Balance training;Therapeutic exercise;Therapeutic activities;Functional mobility training;Stair training;Gait training;DME Instruction;Ultrasound;Neuromuscular re-education;Patient/family education;Passive range of motion;Spinal Manipulations;Joint Manipulations;Dry needling;Taping;Manual techniques    PT Next Visit [place] with Plan of Care Patient           Patient will benefit from skilled therapeutic intervention in order to improve the following deficits and impairments:  Decreased endurance,Pain,Decreased strength,Decreased activity tolerance,Increased muscle spasms,Decreased coordination,Hypermobility,Impaired perceived functional ability,Postural dysfunction  Visit Diagnosis: Acute bilateral low back pain, unspecified whether sciatica present  Muscle weakness (generalized)     Problem List Patient Active Problem List   Diagnosis Date Noted  . Iron deficiency 06/17/2020  . Pain of left lower extremity 06/04/2020  . Chest pain 03/04/2020  . Menorrhagia with regular cycle 01/01/2020  . Fatigue 12/31/2019  . Disorder of SI (sacroiliac) joint 12/11/2019  . Knee pain 09/13/2018  . Anxiety 12/21/2015    MScot Jun PT, DPT, OCS, ATC 06/29/20  1:28 PM    [facility] [address]GVill[address] [place] [address]Phone: [phone]  Fax:   [phone] Name: EARDEL JAGGERMRN: [id number] of Birth: 107-15-1997

## 2020-08-25 ENCOUNTER — Ambulatory Visit: Payer: No Typology Code available for payment source | Admitting: Surgery

## 2020-08-26 ENCOUNTER — Ambulatory Visit: Payer: No Typology Code available for payment source | Admitting: Surgery

## 2020-09-08 ENCOUNTER — Encounter: Payer: Self-pay | Admitting: Internal Medicine

## 2020-09-10 ENCOUNTER — Other Ambulatory Visit: Payer: Self-pay | Admitting: Internal Medicine

## 2020-09-14 ENCOUNTER — Ambulatory Visit: Payer: No Typology Code available for payment source | Admitting: Orthopaedic Surgery

## 2020-09-29 ENCOUNTER — Other Ambulatory Visit: Payer: Self-pay

## 2020-09-29 ENCOUNTER — Ambulatory Visit: Payer: No Typology Code available for payment source | Admitting: Surgery

## 2020-09-29 DIAGNOSIS — M533 Sacrococcygeal disorders, not elsewhere classified: Secondary | ICD-10-CM | POA: Diagnosis not present

## 2020-09-29 DIAGNOSIS — G8929 Other chronic pain: Secondary | ICD-10-CM | POA: Diagnosis not present

## 2020-09-29 DIAGNOSIS — M545 Low back pain, unspecified: Secondary | ICD-10-CM

## 2020-09-29 DIAGNOSIS — M2550 Pain in unspecified joint: Secondary | ICD-10-CM

## 2020-09-29 NOTE — Progress Notes (Signed)
Office Visit Note   Patient: Marissa [name]           Date of Birth: [birth year]           MRN: [id number] Visit Date: 09/29/2020              Requested by: Binnie Rail, MD [place],  [place] [address] PCP: Binnie Rail, MD   Assessment & Plan: Visit Diagnoses:  1. Polyarthralgia   2. Chronic left SI joint pain   3. Chronic bilateral low back pain without sciatica   4. Chronic right SI joint pain     Plan: With patient's ongoing low back symptoms we will go ahead and order lumbar MRI to rule out HNP/gnosis.  Patient has failed conservative treatment with prednisone taper, Advil, formal PT and home exercise program.  The pain that she is having at the bilateral SI joints and knees intermittently I will also get blood work to check a CBC and arthritis panel.  Follow-up in 3 weeks for recheck and to review her labs and MRI.  Follow-Up Instructions: Return in about 3 weeks (around 10/20/2020) for with Covey Baller review labs and lumbar mri scan.   Orders:  Orders Placed This Encounter  Procedures   MR Lumbar Spine w/o contrast   CBC   Sed Rate (ESR)   Antinuclear Antib (ANA)   Rheumatoid Factor   Uric acid   No orders of the defined types were placed in this encounter.     Procedures: No procedures performed   Clinical Data: No additional findings.   Subjective: Chief Complaint  Patient presents with   Lower Back - Follow-up    HPI 25 year old white female returns for recheck of her low back pain.  I last seen patient May 12, 2020 for the same complaint.  Patient has taken prednisone taper and was supposed to follow-up me a while back but states that she got busy.  She also did go to formal PT and did home exercise program.  She complains of left greater than right-sided low back pain that radiates into the buttocks.  Pain increased with ambulation, bending, twisting.  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 Review of Systems No current cardiopulmonary GI GU issues  Objective: Vital Signs: There were no vitals taken for this visit.  Physical Exam Constitutional:      General: She is not in acute distress. HENT:     Head: Normocephalic.  Eyes:     Extraocular Movements: Extraocular movements intact.  Pulmonary:     Effort: No respiratory distress.  Musculoskeletal:     Comments: Gait is normal.  Patient is some discomfort with lumbar extension and lumbar flexion.  Mild to moderate tenderness over the bilateral SI joints.  Negative logroll bilateral hips.  Negative straight leg raise.  Neurovas intact.  No focal deficits.  Neurological:     Mental Status: She is alert and oriented to person, place, and time.  Psychiatric:        Mood and Affect: Mood normal.    Ortho Exam  Specialty Comments:  No specialty comments available.  Imaging: No results found.   PMFS History: Patient Active Problem List   Diagnosis Date Noted   Iron deficiency 06/17/2020   Pain of left lower extremity 06/04/2020   Chest pain 03/04/2020   Menorrhagia with regular cycle 01/01/2020   Fatigue 12/31/2019  Disorder of SI (sacroiliac) joint 12/11/2019   Knee pain 09/13/2018   Anxiety 12/21/2015   Past Medical History:  Diagnosis Date   Allergy     Family History  Problem Relation Age of Onset   Heart disease Father    Renal cancer Maternal Grandfather    Cancer Maternal Grandfather        kidney    Past Surgical History:  Procedure Laterality Date   NO PAST SURGERIES     Social History   Occupational History   Not on file  Tobacco Use   Smoking status: Never   Smokeless tobacco: Never  Substance and Sexual Activity   Alcohol use: No   Drug use: No   Sexual activity: Not on file

## 2020-09-30 LAB — CBC
HCT: 44.5 % (ref 35.0–45.0)
Hemoglobin: 14.6 g/dL (ref 11.7–15.5)
MCH: 28.5 pg (ref 27.0–33.0)
MCHC: 32.8 g/dL (ref 32.0–36.0)
MCV: 86.7 fL (ref 80.0–100.0)
MPV: 11 fL (ref 7.5–12.5)
Platelets: 355 10*3/uL (ref 140–400)
RBC: 5.13 10*6/uL — ABNORMAL HIGH (ref 3.80–5.10)
RDW: 11.8 % (ref 11.0–15.0)
WBC: 7.5 10*3/uL (ref 3.8–10.8)

## 2020-09-30 LAB — ANA: Anti Nuclear Antibody (ANA): NEGATIVE

## 2020-09-30 LAB — RHEUMATOID FACTOR: Rheumatoid fact SerPl-aCnc: <14 IU/mL (ref <14)

## 2020-09-30 LAB — URIC ACID: Uric Acid, Serum: 5.1 mg/dL (ref 2.5–7.0)

## 2020-09-30 LAB — SEDIMENTATION RATE: Sed Rate: 6 mm/h (ref 0–20)

## 2020-10-15 ENCOUNTER — Ambulatory Visit
Admission: RE | Admit: 2020-10-15 | Discharge: 2020-10-15 | Disposition: A | Discharge status: Home / Self Care | Payer: No Typology Code available for payment source | Source: Ambulatory Visit | Attending: Surgery | Admitting: Surgery

## 2020-10-15 ENCOUNTER — Other Ambulatory Visit: Payer: Self-pay

## 2020-10-15 DIAGNOSIS — G8929 Other chronic pain: Secondary | ICD-10-CM

## 2020-10-21 ENCOUNTER — Other Ambulatory Visit: Payer: Self-pay

## 2020-10-21 ENCOUNTER — Ambulatory Visit: Payer: No Typology Code available for payment source | Admitting: Surgery

## 2020-10-21 ENCOUNTER — Encounter: Payer: Self-pay | Admitting: Surgery

## 2020-10-21 VITALS — BP 102/69 | HR 82

## 2020-10-21 DIAGNOSIS — M5136 Other intervertebral disc degeneration, lumbar region: Secondary | ICD-10-CM

## 2020-10-21 NOTE — Progress Notes (Signed)
Office Visit Note   Patient: Marissa [name]           Date of Birth: 1995-11-20           MRN: [id number] Visit Date: 10/21/2020              Requested by: Pincus Sanes, MD [address] [place],  [place] [address] PCP: Pincus Sanes, MD   Assessment & Plan: Visit Diagnoses:  1. DDD (degenerative disc disease), lumbar     Plan: Lumbar MRI reviewed with patient along with blood work.  I will have patient start formal PT and I would like her to go for at least 6 weeks.  Follow-up in 6 weeks for recheck.  We discussed proper body mechanics to avoid worsening her symptoms.  She voices understanding.  If she does not get significant improvement with physical therapy I may send her to Dr. Alvester Morin physiatrist to discuss possible lumbar ESI's.  Follow-Up Instructions: Return in about 6 weeks (around 12/02/2020) for with Tracey Stewart.   Orders:  Orders Placed This Encounter  Procedures   Ambulatory referral to Physical Therapy   No orders of the defined types were placed in this encounter.     Procedures: No procedures performed   Clinical Data: No additional findings.   Subjective: Chief Complaint  Patient presents with   Lower Back - Follow-up    HPI 25 year old white female returns to review lumbar MRI scan that was performed October 15, 2020.  Study showed:  CLINICAL DATA:  Low back pain, > 6 wks worsening low back pain, bilat buttock pain. possible L5 left-sided pars defect   EXAM: MRI LUMBAR SPINE WITHOUT CONTRAST   TECHNIQUE: Multiplanar, multisequence MR imaging of the lumbar spine was performed. No intravenous contrast was administered.   COMPARISON:  X-ray 05/12/2020   FINDINGS: Segmentation:  Standard.   Alignment:  Physiologic.   Vertebrae:  No fracture, evidence of discitis, or bone lesion.   Conus medullaris and cauda equina: Conus extends to the L1-2 level. Conus and cauda equina appear normal.   Paraspinal and other soft tissues: Negative.    Disc levels:   T12-L1: Unremarkable.   L1-L2: Unremarkable.   L2-L3: Unremarkable.   L3-L4: Unremarkable.   L4-L5: Unremarkable.   L5-S1: Disc desiccation with shallow disc protrusion, slightly eccentric to the left. There is slight narrowing of the bilateral subarticular recesses without evidence of neural impingement. Mild bilateral neural foraminal narrowing, left slightly greater than right. No canal stenosis.   IMPRESSION: 1. Mild degenerative disc disease at L5-S1 with mild bilateral neural foraminal narrowing, left slightly greater than right. Mild bilateral subarticular recess stenosis without canal stenosis. 2. Remaining levels are normal in appearance.     Electronically Signed   By: Duanne Guess D.O.   On: 10/15/2020 16:45  Patient states that she is currently having left-sided low back pain into her buttock and also similar symptoms on the right side.  Nothing radiating further down either leg.  No weakness.  States that she only went to 2 formal PT visits previously.  I reviewed blood work that was drawn last office visit and this was unremarkable.    Review of Systems No current cardiopulmonary GI GU issues  Objective: Vital Signs: BP 102/69 (BP Location: Left Arm, Patient Position: Sitting, Cuff Size: Normal)   Pulse 82   SpO2 97%   Physical Exam HENT:     Head: Normocephalic.  Eyes:     Pupils: Pupils are  equal, round, and reactive to light.  Musculoskeletal:     Comments: Tender around the bilateral SI joints.  Negative logroll.  Negative straight leg raise.  No focal motor deficits.  Neurological:     General: No focal deficit present.     Mental Status: She is alert and oriented to person, place, and time.  Psychiatric:        Mood and Affect: Mood normal.    Ortho Exam  Specialty Comments:  No specialty comments available.  Imaging: No results found.   PMFS History: Patient Active Problem List   Diagnosis Date Noted   Iron  deficiency 06/17/2020   Pain of left lower extremity 06/04/2020   Chest pain 03/04/2020   Menorrhagia with regular cycle 01/01/2020   Fatigue 12/31/2019   Disorder of SI (sacroiliac) joint 12/11/2019   Knee pain 09/13/2018   Anxiety 12/21/2015   Past Medical History:  Diagnosis Date   Allergy     Family History  Problem Relation Age of Onset   Heart disease Father    Renal cancer Maternal Grandfather    Cancer Maternal Grandfather        kidney    Past Surgical History:  Procedure Laterality Date   NO PAST SURGERIES     Social History   Occupational History   Not on file  Tobacco Use   Smoking status: Never   Smokeless tobacco: Never  Substance and Sexual Activity   Alcohol use: No   Drug use: No   Sexual activity: Not on file

## 2020-10-26 ENCOUNTER — Encounter: Payer: Self-pay | Admitting: Internal Medicine

## 2020-10-27 ENCOUNTER — Other Ambulatory Visit: Payer: Self-pay

## 2020-10-27 ENCOUNTER — Telehealth (INDEPENDENT_AMBULATORY_CARE_PROVIDER_SITE_OTHER): Payer: No Typology Code available for payment source | Admitting: Family

## 2020-10-27 DIAGNOSIS — U071 COVID-19: Secondary | ICD-10-CM | POA: Diagnosis not present

## 2020-10-27 MED ORDER — PROMETHAZINE-DM 6.25-15 MG/5ML PO SYRP: 5.0000 mL | ORAL_SOLUTION | Freq: Four times a day (QID) | ORAL | 0 refills | Status: DC | PRN

## 2020-10-27 MED ORDER — LIDOCAINE VISCOUS HCL 2 % MT SOLN: 15.0000 mL | Freq: Four times a day (QID) | OROMUCOSAL | 0 refills | Status: DC | PRN

## 2020-10-27 NOTE — Progress Notes (Signed)
MyChart Video Visit    Virtual Visit via Video Note   This visit type was conducted due to national recommendations for restrictions regarding the COVID-19 Pandemic (e.g. social distancing) in an effort to limit this patient's exposure and mitigate transmission in our community. This patient is at least at moderate risk for complications without adequate follow up. This format is felt to be most appropriate for this patient at this time. Physical exam was limited by quality of the video and audio technology used for the visit. CMA was able to get the patient set up on a video visit.  Patient location: Home Patient and provider in visit Provider location: Office  I discussed the limitations of evaluation and management by telemedicine and the availability of in person appointments. The patient expressed understanding and agreed to proceed.  Visit Date: 10/27/2020  Today's healthcare provider: Lemont Fillers, NP     Subjective:    Patient ID: Marissa [name], female    DOB: [birth year], 25 y.o.   MRN: [id number]  Chief Complaint  Patient presents with   Covid Positive    Tested positive on 10-26-20   Covid symptoms    Patient started having symptoms 10-25-20, now having sore throat, cough, headache, fatigue and congestion.     HPI Patient is in today for a video visit. She complains of sore throat, cough, headache, fatigue since Monday, 10/25/2020. She tested positive for Covid-19 that day as well. 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 She has had 3 Covid-19 vaccines at this time. She is requesting medication to manage her sore throat and cough.    Past Medical History:  Diagnosis Date   Allergy     Past Surgical History:  Procedure Laterality Date   NO PAST SURGERIES      Family History  Problem Relation Age of Onset   Heart disease Father    Renal cancer Maternal Grandfather    Cancer Maternal Grandfather         kidney    Social History   Socioeconomic History   Marital status: Single    Spouse name: Not on file   Number of children: Not on file   Years of education: Not on file   Highest education level: Not on file  Occupational History   Not on file  Tobacco Use   Smoking status: Never   Smokeless tobacco: Never  Substance and Sexual Activity   Alcohol use: No   Drug use: No   Sexual activity: Not on file  Other Topics Concern   Not on file  Social History Narrative   Not on file   Social Determinants of Health   Financial Resource Strain: Not on file  Food Insecurity: Not on file  Transportation Needs: Not on file  Physical Activity: Not on file  Stress: Not on file  Social Connections: Not on file  Intimate Partner Violence: Not on file    Outpatient Medications Prior to Visit  Medication Sig Dispense Refill   FLUoxetine (PROZAC) 40 MG capsule TAKE 1 CAPSULE (40 MG TOTAL) BY MOUTH DAILY. 90 capsule 1   Iron, Ferrous Sulfate, 325 (65 Fe) MG TABS Take 325 mg by mouth daily. 30 tablet 0   Multiple Vitamin (MULTIVITAMIN) capsule Take 1 capsule by mouth daily.     No facility-administered medications prior to visit.    No Known Allergies  Review of Systems  Constitutional:  Positive for malaise/fatigue.  HENT:  Positive for sore throat.   Respiratory:  Positive for cough.   Neurological:  Positive for headaches.      Objective:    Physical Exam Constitutional:      General: She is not in acute distress.    Appearance: She is ill-appearing. She is not toxic-appearing.  HENT:     Head: Normocephalic.  Pulmonary:     Effort: Pulmonary effort is normal.  Neurological:     General: No focal deficit present.     Mental Status: She is alert and oriented to person, place, and time.    There were no vitals taken for this visit. Wt Readings from Last 3 Encounters:  06/17/20 180 lb 3.2 oz (81.7 kg)  06/04/20 181 lb 9.6 oz (82.4 kg)  05/12/20 185 lb (83.9 kg)     Diabetic Foot Exam - Simple   No data filed    Lab Results  Component Value Date   WBC 7.5 09/29/2020   HGB 14.6 09/29/2020   HCT 44.5 09/29/2020   PLT 355 09/29/2020   GLUCOSE 81 06/17/2020   CHOL 205 (H) 06/17/2020   TRIG 84.0 06/17/2020   HDL 44.00 06/17/2020   LDLCALC 144 (H) 06/17/2020   ALT 23 06/17/2020   AST 21 06/17/2020   NA 141 06/17/2020   K 4.3 06/17/2020   CL 106 06/17/2020   CREATININE 0.95 06/17/2020   BUN 10 06/17/2020   CO2 28 06/17/2020   TSH 2.17 06/17/2020    Lab Results  Component Value Date   TSH 2.17 06/17/2020   Lab Results  Component Value Date   WBC 7.5 09/29/2020   HGB 14.6 09/29/2020   HCT 44.5 09/29/2020   MCV 86.7 09/29/2020   PLT 355 09/29/2020   Lab Results  Component Value Date   NA 141 06/17/2020   K 4.3 06/17/2020   CO2 28 06/17/2020   GLUCOSE 81 06/17/2020   BUN 10 06/17/2020   CREATININE 0.95 06/17/2020   BILITOT 0.6 06/17/2020   ALKPHOS 72 06/17/2020   AST 21 06/17/2020   ALT 23 06/17/2020   PROT 7.2 06/17/2020   ALBUMIN 4.2 06/17/2020   CALCIUM 9.7 06/17/2020   ANIONGAP 12 03/01/2020   GFR 84.62 06/17/2020   Lab Results  Component Value Date   CHOL 205 (H) 06/17/2020   Lab Results  Component Value Date   HDL 44.00 06/17/2020   Lab Results  Component Value Date   LDLCALC 144 (H) 06/17/2020   Lab Results  Component Value Date   TRIG 84.0 06/17/2020   Lab Results  Component Value Date   CHOLHDL 5 06/17/2020   No results found for: HGBA1C     Assessment & Plan:   Problem List Items Addressed This Visit       Unprioritized   COVID-19 virus infection - Primary    She does not have risk factors that necessitate antiviral therapy.  She wished to focus on symptom management. Rx was provided for viscous lidocaine to help with her sore throat. We also discussed use of OTC motrin 400-600mg  every 6 hours as needed for throat pain. For cough, an rx has been sent to her pharmacy for promethazine DM  cough syrup. Advised of CDC guidelines for self isolation/ ending isolation.  Advised of safe practice guidelines. Symptom Tier reviewed.  Encouraged to monitor for any worsening symptoms; watch for increased shortness of breath, weakness, and signs of dehydration. Advised when to seek emergency care.  Instructed to rest  and hydrate well.  Advised to leave the house during recommended isolation period, only if it is necessary to seek medical care         Meds ordered this encounter  Medications   lidocaine (XYLOCAINE) 2 % solution    Sig: Use as directed 15 mLs in the mouth or throat every 6 (six) hours as needed for mouth pain.    Dispense:  200 mL    Refill:  0    Order Specific Question:   Supervising Provider    Answer:   Danise Edge A [[id number]]   promethazine-dextromethorphan (PROMETHAZINE-DM) 6.25-15 MG/5ML syrup    Sig: Take 5 mLs by mouth 4 (four) times daily as needed for cough.    Dispense:  118 mL    Refill:  0    Order Specific Question:   Supervising Provider    Answer:   Danise Edge A [[id number]]    I discussed the assessment and treatment plan with the patient. The patient was provided an opportunity to ask questions and all were answered. The patient agreed with the plan and demonstrated an understanding of the instructions.   The patient was advised to call back or seek an in-person evaluation if the symptoms worsen or if the condition fails to improve as anticipated.  I,[employer]/textile technologist as a Neurosurgeon for [employer], NP.,have documented all relevant documentation on the behalf of Lemont Fillers, NP,as directed by  Lemont Fillers, NP while in the presence of Lemont Fillers, NP.  I provided 20 minutes of face-to-face time during this encounter.   Lemont Fillers, NP [employer] [facility] [employer] [phone] (phone) [phone] (fax)  [facility]

## 2020-10-27 NOTE — Telephone Encounter (Signed)
Pt has VV today

## 2020-10-27 NOTE — Assessment & Plan Note (Signed)
She does not have risk factors that necessitate antiviral therapy.  She wished to focus on symptom management. Rx was provided for viscous lidocaine to help with her sore throat. We also discussed use of OTC motrin 400-600mg  every 6 hours as needed for throat pain. For cough, an rx has been sent to her pharmacy for promethazine DM cough syrup. Advised of CDC guidelines for self isolation/ ending isolation.  Advised of safe practice guidelines. Symptom Tier reviewed.  Encouraged to monitor for any worsening symptoms; watch for increased shortness of breath, weakness, and signs of dehydration. Advised when to seek emergency care.  Instructed to rest and hydrate well.  Advised to leave the house during recommended isolation period, only if it is necessary to seek medical care

## 2020-11-05 ENCOUNTER — Ambulatory Visit: Payer: No Typology Code available for payment source | Admitting: Physical Therapy

## 2020-11-10 ENCOUNTER — Ambulatory Visit: Payer: No Typology Code available for payment source | Admitting: Physical Therapy

## 2020-12-23 ENCOUNTER — Encounter: Payer: Self-pay | Admitting: Physical Therapy

## 2020-12-23 ENCOUNTER — Ambulatory Visit (INDEPENDENT_AMBULATORY_CARE_PROVIDER_SITE_OTHER): Payer: No Typology Code available for payment source | Admitting: Physical Therapy

## 2020-12-23 ENCOUNTER — Other Ambulatory Visit: Payer: Self-pay

## 2020-12-23 DIAGNOSIS — M545 Low back pain, unspecified: Secondary | ICD-10-CM | POA: Diagnosis not present

## 2020-12-23 DIAGNOSIS — R29898 Other symptoms and signs involving the musculoskeletal system: Secondary | ICD-10-CM | POA: Diagnosis not present

## 2020-12-23 NOTE — Patient Instructions (Signed)
Access Code: [email or web address] URL: [email or web address] Date: 12/23/2020 Prepared by: Moshe Cipro  Exercises Hooklying Bilateral Isometric Clamshell - 1-2 x daily - 7 x weekly - 1 sets - 10 reps - 5 sec hold Supine Bridge with Resistance Band - 1-2 x daily - 7 x weekly - 1 sets - 10 reps - 5 seconds hold Supine 90/90 Single Leg Isometric Hip Extension - 1-2 x daily - 7 x weekly - 1 sets - 10 reps - 5 sec hold Standing Hip Extension with Counter Support - 1-2 x daily - 7 x weekly - 1 sets - 10 reps

## 2020-12-23 NOTE — Therapy (Signed)
[facility] Physical Therapy [address] [place], [place], [address] Phone: [phone]   Fax:  [phone]  Physical Therapy Evaluation  Patient Details  Name: Marissa [name] MRN: [id number] Date of Birth: 04-28-1995 Referring Provider (PT): Zonia Kief, PA-C   Encounter Date: 12/23/2020   PT End of Session - 12/23/20 1418     Visit Number 1    Number of Visits 6    Date for PT Re-Evaluation 02/03/21    Authorization Type Aetna    PT Start Time 1345    PT Stop Time 1415    PT Time Calculation (min) 30 min    Activity Tolerance Patient tolerated treatment well    Behavior During Therapy [facility] for tasks assessed/performed             Past Medical History:  Diagnosis Date   Allergy     Past Surgical History:  Procedure Laterality Date   NO PAST SURGERIES      There were no vitals filed for this visit.    Subjective Assessment - 12/23/20 1342     Subjective Pt is a 25 y/o female who returns to OPPT for acute on chronic LBP.  She's had pain for about 1 year but denies any specific injury.  She reports MRI shows DDD and PT indicated.  If no improvement with PT will consider injections.    Pertinent History anxiety    Patient Stated Goals improve pain    Currently in Pain? Yes    Pain Score 0-No pain   up to 8-9/10   Pain Location Back    Pain Orientation Lower;Left;Right    Pain Descriptors / Indicators Shooting;Sharp    Pain Type Acute pain;Chronic pain    Pain Radiating Towards bil LEs    Pain Onset More than a month ago    Pain Frequency Intermittent    Aggravating Factors  unknown, spontaneous onset    Pain Relieving Factors advil                [facility] PT Assessment - 12/23/20 1336       Assessment   Medical Diagnosis M51.36 (ICD-10-CM) - DDD (degenerative disc disease), lumbar    Referring Provider (PT) Zonia Kief, PA-C    Onset Date/Surgical Date --   chronic x 1 year   Hand Dominance Right    Next MD Visit PRN     Prior Therapy 2 visits this spring      Precautions   Precautions None      Restrictions   Weight Bearing Restrictions No      Balance Screen   Has the patient fallen in the past 6 months No    Has the patient had a decrease in activity level because of a fear of falling?  No    Is the patient reluctant to leave their home because of a fear of falling?  No      Home [employer] information [employer] residence    Living Arrangements Other relatives   roommate   Additional Comments 2 story house with steps to bedroom      Prior Function   Level of Independence Independent    Vocation Full time employment    Psychologist, prison and probation services    Leisure running, home renovations, has tried prior HEP but not consistently      Cognition   Overall Cognitive Status Within Functional Limits for tasks assessed      Observation/Other Assessments  Focus on Therapeutic Outcomes (FOTO)  60 (predicted 73)      Posture/Postural Control   Posture/Postural Control Postural limitations    Postural Limitations Rounded Shoulders;Forward head      ROM / Strength   AROM / PROM / Strength AROM;Strength      AROM   AROM Assessment Site Lumbar    Lumbar Flexion WNL - hands flat on floor    Lumbar Extension hypermobile    Lumbar - Right Side Bend WNL    Lumbar - Left Side Bend WNL    Lumbar - Right Rotation WNL    Lumbar - Left Rotation WNL      Strength   Overall Strength Within functional limits for tasks performed      Flexibility   Soft Tissue Assessment /Muscle Length yes    Hamstrings hyperflexibility bil    Piriformis hyperflexibility bil      Palpation   Spinal mobility WNL    Palpation comment pain bil SIJ      Special Tests    Special Tests Lumbar    Lumbar Tests Slump Test      Slump test   Findings Negative                        Objective measurements completed on examination: See above findings.       [facility] Adult PT  Treatment/Exercise - 12/23/20 1344       Exercises   Exercises Other Exercises    Other Exercises  see pt instructions - reviewed PRN for understanding                       PT Short Term Goals - 12/23/20 1342       PT SHORT TERM GOAL #1   Title Patient will demonstrate independent use of home exercise program to maintain progress from in clinic treatments.    Time 3    Period Weeks    Status New    Target Date 01/13/21               PT Long Term Goals - 12/23/20 1342       PT LONG TERM GOAL #1   Title Patient will demonstrate/report pain at worst less than or equal to 2/10 to facilitate minimal limitation in daily activity secondary to pain symptoms.    Time 6    Period Weeks    Status New    Target Date 02/03/21      PT LONG TERM GOAL #2   Title Patient will demonstrate independent use of home exercise program to facilitate ability to maintain/progress functional gains from skilled physical therapy services.    Time 6    Period Weeks    Status New    Target Date 02/03/21      PT LONG TERM GOAL #3   Title FOTO score at least 73 for improved function    Time 6    Period Weeks    Status New    Target Date 02/03/21      PT LONG TERM GOAL #4   Title n/a      PT LONG TERM GOAL #5   Title n/a                    Plan - 12/23/20 1419     Clinical Impression Statement Pt is a 25 y/o female who presents to OPPT for acute exacerbation of  chronic LBP.  She reports pain is variable and spontaneous, and today's exam largely unremarkable.  She does report pain originates at SIJ so initiated stabilization exercises to see if this is helpful.  She does have some increased flexibility of hamstrings and piriformis so will largely focus on stabilization rather than stretching.  Will benefit from PT to address deficits listed.    Personal Factors and Comorbidities Comorbidity 1    Comorbidities anxiety    Examination-Activity Limitations Locomotion  Level;Transfers;Squat;Stand;Lift    Examination-Participation Restrictions Cleaning;Yard Work;Community Activity;Occupation    Stability/Clinical Decision Making Stable/Uncomplicated    Clinical Decision Making Low    Rehab Potential Good    PT Frequency 1x / week    PT Duration 6 weeks    PT Treatment/Interventions ADLs/Self Care Home Management;Cryotherapy;Electrical Stimulation;Traction;Moist Heat;Functional mobility training;Therapeutic activities;Therapeutic exercise;Patient/family education;Neuromuscular re-education;Manual techniques;Taping;Dry needling    PT Next Visit Plan review HEP and progress core/hip strengthening exercises    PT Home Exercise Plan Access Code: [email or web address]    Consulted and Agree with Plan of Care Patient             Patient will benefit from skilled therapeutic intervention in order to improve the following deficits and impairments:  Hypermobility, Pain, Postural dysfunction  Visit Diagnosis: Acute bilateral low back pain, unspecified whether sciatica present - Plan: PT plan of care cert/re-cert  Other symptoms and signs involving the musculoskeletal system - Plan: PT plan of care cert/re-cert     Problem List Patient Active Problem List   Diagnosis Date Noted   COVID-19 virus infection 10/27/2020   Iron deficiency 06/17/2020   Pain of left lower extremity 06/04/2020   Chest pain 03/04/2020   Menorrhagia with regular cycle 01/01/2020   Fatigue 12/31/2019   Disorder of SI (sacroiliac) joint 12/11/2019   Knee pain 09/13/2018   Anxiety 12/21/2015    Clarita Crane, PT, DPT 12/23/20 2:24 PM   [facility] Physical Therapy [address] [place], [place], [address] Phone: [phone]   Fax:  [phone]  Name: Marissa [name] MRN: [id number] Date of Birth: [birth year]

## 2020-12-27 ENCOUNTER — Ambulatory Visit (INDEPENDENT_AMBULATORY_CARE_PROVIDER_SITE_OTHER): Payer: No Typology Code available for payment source | Admitting: Physical Therapy

## 2020-12-27 ENCOUNTER — Encounter: Payer: Self-pay | Admitting: Physical Therapy

## 2020-12-27 ENCOUNTER — Other Ambulatory Visit: Payer: Self-pay

## 2020-12-27 DIAGNOSIS — M6281 Muscle weakness (generalized): Secondary | ICD-10-CM | POA: Diagnosis not present

## 2020-12-27 DIAGNOSIS — R29898 Other symptoms and signs involving the musculoskeletal system: Secondary | ICD-10-CM

## 2020-12-27 DIAGNOSIS — M545 Low back pain, unspecified: Secondary | ICD-10-CM

## 2020-12-27 NOTE — Therapy (Signed)
[facility] Physical Therapy [address] [place], [place], [address] Phone: [phone]   Fax:  [phone]  Physical Therapy Treatment  Patient Details  Name: Marissa [name] MRN: [id number] Date of Birth: May 01, 1995 Referring Provider (PT): Zonia Kief, PA-C   Encounter Date: 12/27/2020   PT End of Session - 12/27/20 1006     Visit Number 2    Number of Visits 6    Date for PT Re-Evaluation 02/03/21    Authorization Type Aetna    PT Start Time [phone]    PT Stop Time 1008    PT Time Calculation (min) 40 min    Activity Tolerance Patient tolerated treatment well    Behavior During Therapy [facility] for tasks assessed/performed             Past Medical History:  Diagnosis Date   Allergy     Past Surgical History:  Procedure Laterality Date   NO PAST SURGERIES      There were no vitals filed for this visit.   Subjective Assessment - 12/27/20 0929     Subjective reports pain is "about the same."  reports pain was 5/10 about 45 min ago when she woke up but resolved now.    Pertinent History anxiety    Patient Stated Goals improve pain    Currently in Pain? No/denies    Pain Score 0-No pain                               [facility] Adult PT Treatment/Exercise - 12/27/20 0931       Exercises   Exercises Lumbar      Lumbar Exercises: Aerobic   Nustep L8 x 8 min      Lumbar Exercises: Machines for Strengthening   Other Lumbar Machine Exercise rows 20# 3x10    Other Lumbar Machine Exercise lat pulls 20# 3x10      Lumbar Exercises: Standing   Functional Squats --   3x10; 12# KB   Other Standing Lumbar Exercises standing hip extension 10 x 3 sec hold; bil; RDL 12# 3x10    Other Standing Lumbar Exercises anti rotation x 10 reps each direction with 15# on cable      Lumbar Exercises: Supine   Clam 10 reps;5 seconds   isometric with strap   Bridge with clamshell 10 reps;5 seconds    Bridge with [employer] Limitations isometric  with strap    Other Supine Lumbar Exercises isometric hip extension 10 x 5 sec; single limb; performed bil      Lumbar Exercises: Prone   Straight Leg Raise 10 reps;3 seconds    Straight Leg Raises Limitations alternating                       PT Short Term Goals - 12/23/20 1342       PT SHORT TERM GOAL #1   Title Patient will demonstrate independent use of home exercise program to maintain progress from in clinic treatments.    Time 3    Period Weeks    Status New    Target Date 01/13/21               PT Long Term Goals - 12/23/20 1342       PT LONG TERM GOAL #1   Title Patient will demonstrate/report pain at worst less than or equal to 2/10 to facilitate minimal limitation in daily activity  secondary to pain symptoms.    Time 6    Period Weeks    Status New    Target Date 02/03/21      PT LONG TERM GOAL #2   Title Patient will demonstrate independent use of home exercise program to facilitate ability to maintain/progress functional gains from skilled physical therapy services.    Time 6    Period Weeks    Status New    Target Date 02/03/21      PT LONG TERM GOAL #3   Title FOTO score at least 73 for improved function    Time 6    Period Weeks    Status New    Target Date 02/03/21      PT LONG TERM GOAL #4   Title n/a      PT LONG TERM GOAL #5   Title n/a                   Plan - 12/27/20 1006     Clinical Impression Statement Pt tolerated session well today with review of HEP needing min cues for technique.  Rest of session focused on strengthening exercises.  Will continue to benefit from PT to maximize function.    Personal Factors and Comorbidities Comorbidity 1    Comorbidities anxiety    Examination-Activity Limitations Locomotion Level;Transfers;Squat;Stand;Lift    Examination-Participation Restrictions Cleaning;Yard Work;Community Activity;Occupation    Stability/Clinical Decision Making Stable/Uncomplicated    Rehab  Potential Good    PT Frequency 1x / week    PT Duration 6 weeks    PT Treatment/Interventions ADLs/Self Care Home Management;Cryotherapy;Electrical Stimulation;Traction;Moist Heat;Functional mobility training;Therapeutic activities;Therapeutic exercise;Patient/family education;Neuromuscular re-education;Manual techniques;Taping;Dry needling    PT Next Visit Plan review HEP PRN and progress core/hip strengthening exercises    PT Home Exercise Plan Access Code: [email or web address]    Consulted and Agree with Plan of Care Patient             Patient will benefit from skilled therapeutic intervention in order to improve the following deficits and impairments:  Hypermobility, Pain, Postural dysfunction  Visit Diagnosis: Acute bilateral low back pain, unspecified whether sciatica present  Other symptoms and signs involving the musculoskeletal system  Muscle weakness (generalized)     Problem List Patient Active Problem List   Diagnosis Date Noted   COVID-19 virus infection 10/27/2020   Iron deficiency 06/17/2020   Pain of left lower extremity 06/04/2020   Chest pain 03/04/2020   Menorrhagia with regular cycle 01/01/2020   Fatigue 12/31/2019   Disorder of SI (sacroiliac) joint 12/11/2019   Knee pain 09/13/2018   Anxiety 12/21/2015      Clarita Crane, PT, DPT 12/27/20 10:13 AM    [facility] Physical Therapy [address] [place], [place], [address] Phone: [phone]   Fax:  [phone]  Name: HINLEY BRIMAGE MRN: [id number] Date of Birth: 07-28-1995

## 2021-01-06 ENCOUNTER — Encounter: Payer: Self-pay | Admitting: Physical Therapy

## 2021-01-06 ENCOUNTER — Other Ambulatory Visit: Payer: Self-pay

## 2021-01-06 ENCOUNTER — Ambulatory Visit (INDEPENDENT_AMBULATORY_CARE_PROVIDER_SITE_OTHER): Payer: No Typology Code available for payment source | Admitting: Physical Therapy

## 2021-01-06 DIAGNOSIS — R29898 Other symptoms and signs involving the musculoskeletal system: Secondary | ICD-10-CM | POA: Diagnosis not present

## 2021-01-06 DIAGNOSIS — M545 Low back pain, unspecified: Secondary | ICD-10-CM | POA: Diagnosis not present

## 2021-01-06 DIAGNOSIS — M6281 Muscle weakness (generalized): Secondary | ICD-10-CM

## 2021-01-06 NOTE — Therapy (Signed)
[facility] Physical Therapy [address] [place], [place], [address] Phone: [phone]   Fax:  [phone]  Physical Therapy Treatment  Patient Details  Name: Marissa [name] MRN: [id number] Date of Birth: Aug 03, 1995 Referring Provider (PT): Zonia Kief, PA-C   Encounter Date: 01/06/2021   PT End of Session - 01/06/21 1005     Visit Number 3    Number of Visits 6    Date for PT Re-Evaluation 02/03/21    Authorization Type Aetna    PT Start Time [phone]    PT Stop Time 1005    PT Time Calculation (min) 38 min    Activity Tolerance Patient tolerated treatment well    Behavior During Therapy [facility] for tasks assessed/performed             Past Medical History:  Diagnosis Date   Allergy     Past Surgical History:  Procedure Laterality Date   NO PAST SURGERIES      There were no vitals filed for this visit.   Subjective Assessment - 01/06/21 0928     Subjective pain may be a little less intense but otherwise no signficant changes    Pertinent History anxiety    Patient Stated Goals improve pain    Currently in Pain? No/denies                               [facility] Adult PT Treatment/Exercise - 01/06/21 0931       Lumbar Exercises: Aerobic   Nustep L8 x 8 min      Lumbar Exercises: Machines for Strengthening   Other Lumbar Machine Exercise rows 20# 3x10    Other Lumbar Machine Exercise lat pulls 20# 3x10      Lumbar Exercises: Standing   Functional Squats --   3x10; 10# KB   Forward Lunge Limitations reverse lunge with trunk rotation x10 bil with 10# KB    Other Standing Lumbar Exercises RDL 12# 3x10      Lumbar Exercises: Supine   Dead Bug 20 reps      Lumbar Exercises: Sidelying   Clam Both;20 reps      Lumbar Exercises: Prone   Opposite Arm/Leg Raise Right arm/Left leg;Left arm/Right leg;20 reps;3 seconds    Other Prone Lumbar Exercises bil arm/leg raise 2x10                     PT Education -  01/06/21 1005     Education Details HEP    Person(s) Educated Patient    Methods Explanation;Demonstration;Handout    Comprehension Verbalized understanding;Returned demonstration              PT Short Term Goals - 01/06/21 1006       PT SHORT TERM GOAL #1   Title Patient will demonstrate independent use of home exercise program to maintain progress from in clinic treatments.    Time 3    Period Weeks    Status Achieved    Target Date 01/13/21               PT Long Term Goals - 12/23/20 1342       PT LONG TERM GOAL #1   Title Patient will demonstrate/report pain at worst less than or equal to 2/10 to facilitate minimal limitation in daily activity secondary to pain symptoms.    Time 6    Period Weeks    Status New  Target Date 02/03/21      PT LONG TERM GOAL #2   Title Patient will demonstrate independent use of home exercise program to facilitate ability to maintain/progress functional gains from skilled physical therapy services.    Time 6    Period Weeks    Status New    Target Date 02/03/21      PT LONG TERM GOAL #3   Title FOTO score at least 73 for improved function    Time 6    Period Weeks    Status New    Target Date 02/03/21      PT LONG TERM GOAL #4   Title n/a      PT LONG TERM GOAL #5   Title n/a                   Plan - 01/06/21 1006     Clinical Impression Statement Pt compliant and independent with HEP and added strengthening exercises to HEP today.  Feel with consistent exercise pain will improve over time.  Will continue to benefit from PT to maximize function.    Personal Factors and Comorbidities Comorbidity 1    Comorbidities anxiety    Examination-Activity Limitations Locomotion Level;Transfers;Squat;Stand;Lift    Examination-Participation Restrictions Cleaning;Yard Work;Community Activity;Occupation    Stability/Clinical Decision Making Stable/Uncomplicated    Rehab Potential Good    PT Frequency 1x / week    PT  Duration 6 weeks    PT Treatment/Interventions ADLs/Self Care Home Management;Cryotherapy;Electrical Stimulation;Traction;Moist Heat;Functional mobility training;Therapeutic activities;Therapeutic exercise;Patient/family education;Neuromuscular re-education;Manual techniques;Taping;Dry needling    PT Next Visit Plan progress core/hip strengthening exercises; last scheduled visit (?dc or continue?)    PT Home Exercise Plan Access Code: [email or web address]    Consulted and Agree with Plan of Care Patient             Patient will benefit from skilled therapeutic intervention in order to improve the following deficits and impairments:  Hypermobility, Pain, Postural dysfunction  Visit Diagnosis: Acute bilateral low back pain, unspecified whether sciatica present  Other symptoms and signs involving the musculoskeletal system  Muscle weakness (generalized)     Problem List Patient Active Problem List   Diagnosis Date Noted   COVID-19 virus infection 10/27/2020   Iron deficiency 06/17/2020   Pain of left lower extremity 06/04/2020   Chest pain 03/04/2020   Menorrhagia with regular cycle 01/01/2020   Fatigue 12/31/2019   Disorder of SI (sacroiliac) joint 12/11/2019   Knee pain 09/13/2018   Anxiety 12/21/2015      Clarita Crane, PT, DPT 01/06/21 10:08 AM     [facility] Physical Therapy [address] [place], [place], [address] Phone: [phone]   Fax:  [phone]  Name: Marissa [name] MRN: [id number] Date of Birth: 1995-07-24

## 2021-01-06 NOTE — Patient Instructions (Signed)
Access Code: [email or web address] URL: [email or web address] Date: 01/06/2021 Prepared by: Moshe Cipro  Exercises Hooklying Bilateral Isometric Clamshell - 1-2 x daily - 7 x weekly - 1 sets - 10 reps - 5 sec hold Supine Bridge with Resistance Band - 1-2 x daily - 7 x weekly - 1 sets - 10 reps - 5 seconds hold Supine 90/90 Single Leg Isometric Hip Extension - 1-2 x daily - 7 x weekly - 1 sets - 10 reps - 5 sec hold Standing Hip Extension with Counter Support - 1-2 x daily - 7 x weekly - 1 sets - 10 reps Kettlebell Squat - 1 x daily - 7 x weekly - 3 sets - 10 reps Kettlebell Deadlift - 1 x daily - 7 x weekly - 3 sets - 10 reps Reverse Lunge with Rotation and Medicine Ball - 1 x daily - 7 x weekly - 3 sets - 10 reps

## 2021-01-12 ENCOUNTER — Other Ambulatory Visit: Payer: Self-pay

## 2021-01-12 ENCOUNTER — Ambulatory Visit (INDEPENDENT_AMBULATORY_CARE_PROVIDER_SITE_OTHER): Payer: No Typology Code available for payment source | Admitting: Physical Therapy

## 2021-01-12 ENCOUNTER — Encounter: Payer: Self-pay | Admitting: Physical Therapy

## 2021-01-12 DIAGNOSIS — M6281 Muscle weakness (generalized): Secondary | ICD-10-CM | POA: Diagnosis not present

## 2021-01-12 DIAGNOSIS — R29898 Other symptoms and signs involving the musculoskeletal system: Secondary | ICD-10-CM

## 2021-01-12 DIAGNOSIS — M545 Low back pain, unspecified: Secondary | ICD-10-CM | POA: Diagnosis not present

## 2021-01-12 NOTE — Therapy (Addendum)
[facility] Physical Therapy [address] [place], [place], [address] Phone: [phone]   Fax:  [phone]  Physical Therapy Treatment /Discharge   Patient Details  Name: ESTHA FEW MRN: [id number] Date of Birth: 1995-06-21 Referring Provider (PT): Benjiman Core, PA-C   Encounter Date: 01/12/2021   PT End of Session - 01/12/21 1048     Visit Number 4    Number of Visits 6    Date for PT Re-Evaluation 02/03/21    Authorization Type [employer]    PT Start Time 0930    PT Stop Time 0955    PT Time Calculation (min) 25 min    Activity Tolerance Patient tolerated treatment well    Behavior During Therapy [facility] for tasks assessed/performed             Past Medical History:  Diagnosis Date   Allergy     Past Surgical History:  Procedure Laterality Date   NO PAST SURGERIES      There were no vitals filed for this visit.   Subjective Assessment - 01/12/21 0929     Subjective "it's the same, but not worse."  pain is in the mornings    Pertinent History anxiety    Patient Stated Goals improve pain                [facility] PT Assessment - 01/12/21 1045       Assessment   Medical Diagnosis M51.36 (ICD-10-CM) - DDD (degenerative disc disease), lumbar    Referring Provider (PT) Benjiman Core, PA-C      Observation/Other Assessments   Focus on Therapeutic Outcomes (FOTO)  79      Palpation   SI assessment  pain with spring recoil on Lt superior and inferior sacrum                           [facility] Adult PT Treatment/Exercise - 01/12/21 1046       Manual Therapy   Manual therapy comments instructed in use of towel for Lt P/A LLLD stretch along sacrum and ischial tuberosity x 2 min, x 5 min - provided for home to trial                       PT Short Term Goals - 01/06/21 1006       PT SHORT TERM GOAL #1   Title Patient will demonstrate independent use of home exercise program to maintain progress from in clinic  treatments.    Time 3    Period Weeks    Status Achieved    Target Date 01/13/21               PT Long Term Goals - 01/12/21 1048       PT LONG TERM GOAL #1   Title Patient will demonstrate/report pain at worst less than or equal to 2/10 to facilitate minimal limitation in daily activity secondary to pain symptoms.    Time 6    Period Weeks    Status On-going    Target Date 02/03/21      PT LONG TERM GOAL #2   Title Patient will demonstrate independent use of home exercise program to facilitate ability to maintain/progress functional gains from skilled physical therapy services.    Baseline 11/16: met to date    Time 6    Period Weeks    Status On-going    Target Date 02/03/21  PT LONG TERM GOAL #3   Title FOTO score at least 73 for improved function    Time 6    Period Weeks    Status Achieved      PT LONG TERM GOAL #4   Title n/a      PT LONG TERM GOAL #5   Title n/a                   Plan - 01/12/21 1048     Clinical Impression Statement Pt has met FOTO score and is compliant with current HEP.  Did update to include LLLD stretch to SIJ on Lt side and will plan to reassess next visit.  Anticipate we are nearing d/c.    Personal Factors and Comorbidities Comorbidity 1    Comorbidities anxiety    Examination-Activity Limitations Locomotion Level;Transfers;Squat;Stand;Lift    Examination-Participation Restrictions Cleaning;Yard Work;Community Activity;Occupation    Stability/Clinical Decision Making Stable/Uncomplicated    Rehab Potential Good    PT Frequency 1x / week    PT Duration 6 weeks    PT Treatment/Interventions ADLs/Self Care Home Management;Cryotherapy;Electrical Stimulation;Traction;Moist Heat;Functional mobility training;Therapeutic activities;Therapeutic exercise;Patient/family education;Neuromuscular re-education;Manual techniques;Taping;Dry needling    PT Next Visit Plan progress core/hip strengthening exercises; follow up in 2  weeks to reassess    PT Home Exercise Plan Access Code: [email or web address]    Consulted and Agree with Plan of Care Patient             Patient will benefit from skilled therapeutic intervention in order to improve the following deficits and impairments:  Hypermobility, Pain, Postural dysfunction  Visit Diagnosis: Acute bilateral low back pain, unspecified whether sciatica present  Other symptoms and signs involving the musculoskeletal system  Muscle weakness (generalized)     Problem List Patient Active Problem List   Diagnosis Date Noted   COVID-19 virus infection 10/27/2020   Iron deficiency 06/17/2020   Pain of left lower extremity 06/04/2020   Chest pain 03/04/2020   Menorrhagia with regular cycle 01/01/2020   Fatigue 12/31/2019   Disorder of SI (sacroiliac) joint 12/11/2019   Knee pain 09/13/2018   Anxiety 12/21/2015      Laureen Abrahams, PT, DPT 01/12/21 10:52 AM  PHYSICAL THERAPY DISCHARGE SUMMARY  Visits from Start of Care: 4  Current functional level related to goals / functional outcomes: See note   Remaining deficits: See note   Education / Equipment: HEP   Patient agrees to discharge. Patient goals were partially met. Patient is being discharged due to not returning since the last visit.  Scot Jun, PT, DPT, OCS, ATC 02/08/21  10:15 AM      [facility] Physical Therapy [address] [place], [place], [address] Phone: [phone]   Fax:  [phone]  Name: SANDIE SWAYZE MRN: [id number] Date of Birth: [birth year]

## 2021-01-26 ENCOUNTER — Encounter: Payer: Self-pay | Admitting: Internal Medicine

## 2021-01-26 ENCOUNTER — Encounter: Payer: No Typology Code available for payment source | Admitting: Physical Therapy

## 2021-01-26 NOTE — Patient Instructions (Addendum)
Take the antibiotic as prescribed.  Take tylenol if needed.     Increase your water intake.   Call if no improvement     Urinary Tract Infection, Adult A urinary tract infection (UTI) is an infection of any part of the urinary tract, which includes the kidneys, ureters, bladder, and urethra. These organs make, store, and get rid of urine in the body. UTI can be a bladder infection (cystitis) or kidney infection (pyelonephritis). What are the causes? This infection may be caused by fungi, viruses, or bacteria. Bacteria are the most common cause of UTIs. This condition can also be caused by repeated incomplete emptying of the bladder during urination. What increases the risk? This condition is more likely to develop if:  You ignore your need to urinate or hold urine for long periods of time.  You do not empty your bladder completely during urination.  You wipe back to front after urinating or having a bowel movement, if you are female.  You are uncircumcised, if you are female.  You are constipated.  You have a urinary catheter that stays in place (indwelling).  You have a weak defense (immune) system.  You have a medical condition that affects your bowels, kidneys, or bladder.  You have diabetes.  You take antibiotic medicines frequently or for long periods of time, and the antibiotics no longer work well against certain types of infections (antibiotic resistance).  You take medicines that irritate your urinary tract.  You are exposed to chemicals that irritate your urinary tract.  You are female.  What are the signs or symptoms? Symptoms of this condition include:  Fever.  Frequent urination or passing small amounts of urine frequently.  Needing to urinate urgently.  Pain or burning with urination.  Urine that smells bad or unusual.  Cloudy urine.  Pain in the lower abdomen or back.  Trouble urinating.  Blood in the urine.  Vomiting or being less hungry than  normal.  Diarrhea or abdominal pain.  Vaginal discharge, if you are female.  How is this diagnosed? This condition is diagnosed with a medical history and physical exam. You will also need to provide a urine sample to test your urine. Other tests may be done, including:  Blood tests.  Sexually transmitted disease (STD) testing.  If you have had more than one UTI, a cystoscopy or imaging studies may be done to determine the cause of the infections. How is this treated? Treatment for this condition often includes a combination of two or more of the following:  Antibiotic medicine.  Other medicines to treat less common causes of UTI.  Over-the-counter medicines to treat pain.  Drinking enough water to stay hydrated.  Follow these instructions at home:  Take over-the-counter and prescription medicines only as told by your health care provider.  If you were prescribed an antibiotic, take it as told by your health care provider. Do not stop taking the antibiotic even if you start to feel better.  Avoid alcohol, caffeine, tea, and carbonated beverages. They can irritate your bladder.  Drink enough fluid to keep your urine clear or pale yellow.  Keep all follow-up visits as told by your health care provider. This is important.  Make sure to: ? Empty your bladder often and completely. Do not hold urine for long periods of time. ? Empty your bladder before and after sex. ? Wipe from front to back after a bowel movement if you are female. Use each tissue one time when you   wipe. Contact a health care provider if:  You have back pain.  You have a fever.  You feel nauseous or vomit.  Your symptoms do not get better after 3 days.  Your symptoms go away and then return. Get help right away if:  You have severe back pain or lower abdominal pain.  You are vomiting and cannot keep down any medicines or water. This information is not intended to replace advice given to you by  your health care provider. Make sure you discuss any questions you have with your health care provider. Document Released: 11/23/2004 Document Revised: 07/28/2015 Document Reviewed: 01/04/2015 Elsevier Interactive Patient Education  2018 Elsevier Inc[employer]   

## 2021-01-26 NOTE — Progress Notes (Signed)
Subjective:    Patient ID: Marissa [name], female    DOB: 11-23-1995, 23 y.o.   MRN: [id number]  This visit occurred during the SARS-CoV-2 public health emergency.  Safety protocols were in place, including screening questions prior to the visit, additional usage of staff PPE, and extensive cleaning of exam room while observing appropriate contact time as indicated for disinfecting solutions.    HPI The patient is here for an acute visit.   ? UTI:  Her symptoms started  2-3 days ago.  She states dysuria, urinary frequency.   She denies urinary urgency, hematuria, abdominal pain, back pain, nausea, fever.  Last UTI was years ago - when she was a kid.  Neg pregnancy test.  Period is late by 4 days.    Medications and allergies reviewed with patient and updated if appropriate.  Patient Active Problem List   Diagnosis Date Noted   COVID-19 virus infection 10/27/2020   Iron deficiency 06/17/2020   Pain of left lower extremity 06/04/2020   Chest pain 03/04/2020   Menorrhagia with regular cycle 01/01/2020   Fatigue 12/31/2019   Disorder of SI (sacroiliac) joint 12/11/2019   Knee pain 09/13/2018   Anxiety 12/21/2015    Current Outpatient Medications on File Prior to Visit  Medication Sig Dispense Refill   FLUoxetine (PROZAC) 40 MG capsule TAKE 1 CAPSULE (40 MG TOTAL) BY MOUTH DAILY. 90 capsule 1   Multiple Vitamin (MULTI VITAMIN DAILY PO) Take by mouth.     Iron, Ferrous Sulfate, 325 (65 Fe) MG TABS Take 325 mg by mouth daily. (Patient not taking: Reported on 01/27/2021) 30 tablet 0   No current facility-administered medications on file prior to visit.    Past Medical History:  Diagnosis Date   Allergy     Past Surgical History:  Procedure Laterality Date   NO PAST SURGERIES      Social History   Socioeconomic History   Marital status: Single    Spouse name: Not on file   Number of children: Not on file   Years of education: Not on file   Highest education  level: Not on file  Occupational History   Not on file  Tobacco Use   Smoking status: Never   Smokeless tobacco: Never  Substance and Sexual Activity   Alcohol use: No   Drug use: No   Sexual activity: Not on file  Other Topics Concern   Not on file  Social History Narrative   Not on file   Social Determinants of Health   Financial Resource Strain: Not on file  Food Insecurity: Not on file  Transportation Needs: Not on file  Physical Activity: Not on file  Stress: Not on file  Social Connections: Not on file    Family History  Problem Relation Age of Onset   Heart disease Father    Renal cancer Maternal Grandfather    Cancer Maternal Grandfather        kidney    Review of Systems  Constitutional:  Negative for chills and fever.  Genitourinary:  Positive for dysuria and vaginal discharge (WNL). Negative for pelvic pain.      Objective:   Vitals:   01/27/21 0943  BP: 100/78  Pulse: 100  Temp: 98.3 F (36.8 C)  SpO2: 97%   BP Readings from Last 3 Encounters:  01/27/21 100/78  10/21/20 102/69  06/17/20 106/74   Wt Readings from Last 3 Encounters:  01/27/21 190 lb (86.2 kg)  06/17/20 180  lb 3.2 oz (81.7 kg)  06/04/20 181 lb 9.6 oz (82.4 kg)   Body mass index is 28.06 kg/m.   Physical Exam    Constitutional:      General: She is not in acute distress.    Appearance: Normal appearance. She is not ill-appearing.  HENT:     Head: Normocephalic and atraumatic.  Abdominal:     General: There is no distension.     Palpations: Abdomen is soft.     Tenderness: There is mild suprapubic tenderness. There is no right CVA tenderness, left CVA tenderness, guarding or rebound.  Skin:    General: Skin is warm and dry.  Neurological:     Mental Status: She is alert.       Assessment & Plan:    See Problem List for Assessment and Plan of chronic medical problems.

## 2021-01-27 ENCOUNTER — Ambulatory Visit: Payer: No Typology Code available for payment source | Admitting: Internal Medicine

## 2021-01-27 ENCOUNTER — Other Ambulatory Visit: Payer: Self-pay

## 2021-01-27 VITALS — BP 100/78 | HR 100 | Temp 98.3°F | Ht 69.0 in | Wt 190.0 lb

## 2021-01-27 DIAGNOSIS — R300 Dysuria: Secondary | ICD-10-CM | POA: Diagnosis not present

## 2021-01-27 DIAGNOSIS — R5383 Other fatigue: Secondary | ICD-10-CM

## 2021-01-27 DIAGNOSIS — N3001 Acute cystitis with hematuria: Secondary | ICD-10-CM | POA: Diagnosis not present

## 2021-01-27 DIAGNOSIS — N926 Irregular menstruation, unspecified: Secondary | ICD-10-CM

## 2021-01-27 LAB — POC URINALSYSI DIPSTICK (AUTOMATED)
Bilirubin, UA: NEGATIVE
Glucose, UA: NEGATIVE
Ketones, UA: NEGATIVE
Leukocytes, UA: NEGATIVE
Nitrite, UA: NEGATIVE
Protein, UA: POSITIVE — AB
Spec Grav, UA: 1.02 (ref 1.010–1.025)
Urobilinogen, UA: 0.2 E.U./dL
pH, UA: 7 (ref 5.0–8.0)

## 2021-01-27 LAB — POCT URINE PREGNANCY: Preg Test, Ur: NEGATIVE

## 2021-01-27 MED ORDER — NITROFURANTOIN MONOHYD MACRO 100 MG PO CAPS: 100.0000 mg | ORAL_CAPSULE | Freq: Two times a day (BID) | ORAL | 0 refills | Status: DC

## 2021-01-27 NOTE — Addendum Note (Signed)
Addended by: Karma Ganja on: 01/27/2021 04:46 PM   Modules accepted: Orders

## 2021-01-27 NOTE — Assessment & Plan Note (Signed)
Chronic Continues to have fatigue She states most nights she is sleeping 10-12 hours a night and is still exhausted throughout the day.  She still wakes up exhausted Her father does have sleep apnea Discussed that she may have sleep apnea or other sleep disorder and should consider a sleep study-she will think about this and let me know

## 2021-01-27 NOTE — Assessment & Plan Note (Signed)
Acute Symptoms suggestive of UTI Urine dip here has some indication of a possible infection, but I do not 100%-we will send for culture and we will start Macrobid 100 mg twice daily x7 days for possible bacterial infection She is sexually active and has a history of chlamydia so STD is also possible Will send urine for gonorrhea and chlamydia She has follow-up with her gynecologist and deferred further STD testing at this time, but will get it through her gynecologist Urine pregnancy negative

## 2021-01-27 NOTE — Assessment & Plan Note (Addendum)
Acute Symptoms suggestive of UTI and urine dip could indicate it Will send urine for culture She is sexually active and does have a history of having had chlamydia so we will also send for chlamydia/gonorrhea Urine pregnancy negative Take the antibiotic as prescribed.  Macrobid 100 mg twice daily x7 days Take tylenol if needed.   Increase your water intake.  Call if no improvement

## 2021-01-30 LAB — GC/CHLAMYDIA PROBE AMP
Chlamydia trachomatis, NAA: NEGATIVE
Neisseria Gonorrhoeae by PCR: NEGATIVE

## 2021-04-21 ENCOUNTER — Other Ambulatory Visit: Payer: Self-pay | Admitting: Internal Medicine

## 2021-05-09 ENCOUNTER — Encounter: Payer: Self-pay | Admitting: Internal Medicine

## 2021-05-24 ENCOUNTER — Other Ambulatory Visit: Payer: Self-pay

## 2021-05-24 ENCOUNTER — Encounter: Payer: Self-pay | Admitting: Orthopaedic Surgery

## 2021-05-24 ENCOUNTER — Ambulatory Visit: Payer: No Typology Code available for payment source | Admitting: Orthopaedic Surgery

## 2021-05-24 DIAGNOSIS — M5126 Other intervertebral disc displacement, lumbar region: Secondary | ICD-10-CM

## 2021-05-24 NOTE — Progress Notes (Signed)
? ?Office Visit Note ?  ?Patient: Marissa [name]           ?Date of Birth: 09-11-1995           ?MRN: [id number] ?Visit Date: 05/24/2021 ?             ?Requested by: Pincus Sanes, MD ?[address] ?[place],  [place] [address] ?PCP: Pincus Sanes, MD ? ? ?Assessment & Plan: ?Visit Diagnoses:  ?1. Protrusion of lumbar intervertebral disc   ? ? ?Plan: Patient has single level disc protrusion with some foraminal narrowing on the left.  Would recommend a single epidural injection.  She can continue normal work activities I will recheck her in 2 months.  MRI scan pathophysiology discussed I gave her copy of the report.  Images were reviewed with her. ? ?Follow-Up Instructions: Return in about 2 months (around 07/24/2021).  ? ?Orders:  ?No orders of the defined types were placed in this encounter. ? ?No orders of the defined types were placed in this encounter. ? ? ? ? Procedures: ?No procedures performed ? ? ?Clinical Data: ?No additional findings. ? ? ?Subjective: ?Chief Complaint  ?Patient presents with  ? Lower Back - Pain  ? ? ?HPI 26 year old female [name] day [facility] OCT with back and left leg pain that radiates down to her toes at times.  At times she feels like her legs feel weak.  She is used some ibuprofen with some relief she has been through physical therapy greater than a visit with persistent left leg symptoms.  MRI scan shows single level disc degeneration dehydration with some disc bulge.  Neural foraminal narrowing slightly greater left than right.  All other levels are entirely normal. ? ?Review of Systems no bowel or bladder symptoms no chills or fever.  All other systems noncontributory. ? ? ?Objective: ?Vital Signs: BP 108/76   Pulse 84   Ht 5\' 9"  (1.753 m)   Wt 185 lb (83.9 kg)   BMI 27.32 kg/m?  ? ?Physical Exam ?Constitutional:   ?   Appearance: She is well-developed.  ?HENT:  ?   Head: Normocephalic.  ?   Right Ear: External ear normal.  ?   Left Ear: External ear normal. There is no  impacted cerumen.  ?Eyes:  ?   Pupils: Pupils are equal, round, and reactive to light.  ?Neck:  ?   Thyroid: No thyromegaly.  ?   Trachea: No tracheal deviation.  ?Cardiovascular:  ?   Rate and Rhythm: Normal rate.  ?Pulmonary:  ?   Effort: Pulmonary effort is normal.  ?Abdominal:  ?   Palpations: Abdomen is soft.  ?Musculoskeletal:  ?   Cervical back: No rigidity.  ?Skin: ?   General: Skin is warm and dry.  ?Neurological:  ?   Mental Status: She is alert and oriented to person, place, and time.  ?Psychiatric:     ?   Behavior: Behavior normal.  ? ? ?Ortho Exam patient has negative straight leg raising normal heel and toe walking.  Anterior tib gastrocsoleus is strong no atrophy.  She has sciatic notch tenderness on the left some pain with popliteal compression with straight leg raising 90 degrees.  Negative logroll hips knee exam is normal. ? ?Specialty Comments:  ?No specialty comments available. ? ?Imaging: ?Narrative & Impression  ?CLINICAL DATA:  Low back pain, > 6 wks worsening low back pain, ?bilat buttock pain. possible L5 left-sided pars defect ?  ?EXAM: ?MRI LUMBAR SPINE WITHOUT  CONTRAST ?  ?TECHNIQUE: ?Multiplanar, multisequence MR imaging of the lumbar spine was ?performed. No intravenous contrast was administered. ?  ?COMPARISON:  X-ray 05/12/2020 ?  ?FINDINGS: ?Segmentation:  Standard. ?  ?Alignment:  Physiologic. ?  ?Vertebrae:  No fracture, evidence of discitis, or bone lesion. ?  ?Conus medullaris and cauda equina: Conus extends to the L1-2 level. ?Conus and cauda equina appear normal. ?  ?Paraspinal and other soft tissues: Negative. ?  ?Disc levels: ?  ?T12-L1: Unremarkable. ?  ?L1-L2: Unremarkable. ?  ?L2-L3: Unremarkable. ?  ?L3-L4: Unremarkable. ?  ?L4-L5: Unremarkable. ?  ?L5-S1: Disc desiccation with shallow disc protrusion, slightly ?eccentric to the left. There is slight narrowing of the bilateral ?subarticular recesses without evidence of neural impingement. Mild ?bilateral neural foraminal  narrowing, left slightly greater than ?right. No canal stenosis. ?  ?IMPRESSION: ?1. Mild degenerative disc disease at L5-S1 with mild bilateral ?neural foraminal narrowing, left slightly greater than right. Mild ?bilateral subarticular recess stenosis without canal stenosis. ?2. Remaining levels are normal in appearance. ?  ?  ?Electronically Signed ?  By: Duanne Guess D.O. ?  On: 10/15/2020 16:45 ?   ? ? ? ?PMFS History: ?Patient Active Problem List  ? Diagnosis Date Noted  ? Protrusion of lumbar intervertebral disc 05/24/2021  ? Acute cystitis with hematuria 01/27/2021  ? Dysuria 01/27/2021  ? COVID-19 virus infection 10/27/2020  ? Iron deficiency 06/17/2020  ? Pain of left lower extremity 06/04/2020  ? Chest pain 03/04/2020  ? Menorrhagia with regular cycle 01/01/2020  ? Fatigue 12/31/2019  ? Knee pain 09/13/2018  ? Anxiety 12/21/2015  ? ?Past Medical History:  ?Diagnosis Date  ? Allergy   ?  ?Family History  ?Problem Relation Age of Onset  ? Heart disease Father   ? Renal cancer Maternal Grandfather   ? Cancer Maternal Grandfather   ?     kidney  ?  ?Past Surgical History:  ?Procedure Laterality Date  ? NO PAST SURGERIES    ? ?Social History  ? ?Occupational History  ? Not on file  ?Tobacco Use  ? Smoking status: Never  ? Smokeless tobacco: Never  ?Substance and Sexual Activity  ? Alcohol use: No  ? Drug use: No  ? Sexual activity: Not on file  ? ? ? ? ? ? ?

## 2021-05-24 NOTE — Addendum Note (Signed)
Addended by: Rogers Seeds on: 05/24/2021 10:27 AM ? ? Modules accepted: Orders ? ?

## 2021-05-25 ENCOUNTER — Encounter: Payer: Self-pay | Admitting: Neurology

## 2021-05-25 ENCOUNTER — Ambulatory Visit: Payer: No Typology Code available for payment source | Admitting: Neurology

## 2021-05-25 VITALS — BP 113/72 | HR 102 | Ht 69.0 in | Wt 198.2 lb

## 2021-05-25 DIAGNOSIS — G478 Other sleep disorders: Secondary | ICD-10-CM

## 2021-05-25 DIAGNOSIS — R0683 Snoring: Secondary | ICD-10-CM

## 2021-05-25 DIAGNOSIS — R351 Nocturia: Secondary | ICD-10-CM

## 2021-05-25 DIAGNOSIS — Z820 Family history of epilepsy and other diseases of the nervous system: Secondary | ICD-10-CM

## 2021-05-25 DIAGNOSIS — G4719 Other hypersomnia: Secondary | ICD-10-CM

## 2021-05-25 DIAGNOSIS — R519 Headache, unspecified: Secondary | ICD-10-CM

## 2021-05-25 DIAGNOSIS — R635 Abnormal weight gain: Secondary | ICD-10-CM | POA: Diagnosis not present

## 2021-05-25 NOTE — Patient Instructions (Signed)
Thank you for choosing [facility] for your sleep related care! It was nice to meet you today!  ? ?Here is what we discussed today:  ?  ?Based on your symptoms and your exam I believe you are at risk for obstructive sleep apnea (aka OSA). We should proceed with a sleep study to determine whether you do or do not have OSA and how severe it is. Even, if you have mild OSA, I may want you to consider treatment with CPAP, as treatment of even borderline or mild sleep apnea can result and improvement of symptoms such as sleep disruption, daytime sleepiness, nighttime bathroom breaks, restless leg symptoms, improvement of headache syndromes, even improved mood disorder.  ? ?As explained, an attended sleep study (meaning you get to stay overnight in the sleep lab), lets [place] monitor sleep-related behaviors such as sleep talking and leg movements in sleep, in addition to monitoring for sleep apnea.  A home sleep test is a screening tool for sleep apnea diagnosis only, but unfortunately, does not help with any other sleep-related diagnoses. ? ?Please remember, the long-term risks and ramifications of untreated moderate to severe obstructive sleep apnea may include (but are not limited to): increased risk for cardiovascular disease, including congestive heart failure, stroke, difficult to control hypertension, treatment resistant obesity, arrhythmias, especially irregular heartbeat commonly known as A. Fib. (atrial fibrillation); even type 2 diabetes has been linked to untreated OSA.  ? ?Other correlations that untreated obstructive sleep apnea include macular edema which is swelling of the retina in the eyes, droopy eyelid syndrome, and elevated hemoglobin and hematocrit levels (often referred to as polycythemia). ? ?Sleep apnea can cause disruption of sleep and sleep deprivation in most cases, which, in turn, can cause recurrent headaches, problems with memory, mood, concentration, focus, and vigilance. Most  people with untreated sleep apnea report excessive daytime sleepiness, which can affect their ability to drive. Please do not drive or use heavy equipment or machinery, if you feel sleepy! Patients with sleep apnea can also develop difficulty initiating and maintaining sleep (aka insomnia).  ? ?Having sleep apnea may increase your risk for other sleep disorders, including involuntary behaviors sleep such as sleep terrors, sleep talking, sleepwalking.   ? ?Having sleep apnea can also increase your risk for restless leg syndrome and leg movements at night.  ? ?Please note that untreated obstructive sleep apnea may carry additional perioperative morbidity. Patients with significant obstructive sleep apnea (typically, in the moderate to severe degree) should receive, if possible, perioperative PAP (positive airway pressure) therapy and the surgeons and particularly the anesthesiologists should be informed of the diagnosis and the severity of the sleep disordered breathing.  ? ?We will call you or email you through MyChart with regards to your test results and plan a follow-up in sleep clinic accordingly. ?Most likely, you will hear from one of our nurses.  ? ?Our sleep lab administrative assistant will call you to schedule your sleep study and give you further instructions, regarding the check in process for the sleep study, arrival time, what to bring, when you can expect to leave after the study, etc., and to answer any other logistical questions you may have. If you don't hear back from her by about 2 weeks from now, please feel free to call her direct line at [phone] or you can call our general clinic number, or email [place] through My Chart.  ? ?As discussed, we may consider extended sleep testing down the road for you if your  sleep testing initially is negative for sleep apnea.  For testing for narcolepsy or what we call idiopathic hypersomnolence, you will have to taper off the Prozac and stay off of it for  about 3 weeks prior to testing and your sleep study would involve a nighttime sleep study and a next day nap study.  We will discuss again in the future if needed. ?

## 2021-05-25 NOTE — Progress Notes (Signed)
Subjective:  ?  ?Patient ID: Marissa [name] is a 26 y.o. female. ? ?HPI ? ? ?Star Age, MD, PhD ?[facility] ?[address], [address] ?[phone] ?[place], [place] [address] ? ?Dear Dr. Quay Burow,  ? ?I saw your patient, Marissa [name], upon your kind request in my sleep clinic today for initial consultation of her Sleep disorder, in particular, her excessive daytime somnolence. The patient is unaccompanied today.  As you know, Marissa [name] is a 26 year old right-handed woman with an underlying medical history of allergies, history of cystitis, anxiety, iron deficiency, and mildly overweight state, who reports an approximately 1-1/2-year history of excessive daytime somnolence, nonrestorative sleep, and lack of energy.  I reviewed your office note from 01/27/2021.  She has had blood work through in August 2022 including rheumatoid factor, ANA, ESR, CBC.  Lipid panel, TSH and iron studies in April 2022.  She reports intermittent snoring which per her boyfriend is not loud.  She has had occasional morning headaches and has nocturia about once per average night.  Epworth sleepiness score is 14 out of 24, fatigue severity score is 52/63.  In the past year, she has gained weight.  She has been on Prozac for years now.  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  She is planning to go to [employer] school.  She works from 7 AM to 3:30 PM, Mondays through Fridays.  She lives with her boyfriend.  She drinks caffeine in the form of coffee, 1 or 2 cups/day and occasional energy drink, not daily.  She reports a family history of sleep apnea, her father has a CPAP machine.  She denies any recurrent sleep paralysis, has had infrequent migraines, maybe 2 or 3 times in her life, does have a family history of migraines in her mom.  She does not like to nap and does not typically take any inadvertent naps either.  She alcohol occasionally, maybe once a week, she does not smoke any cigarettes, denies any marijuana use,  has vaped infrequently in the past, while in college.  ?She denies any telltale symptoms of cataplexy.  ?She does not wake up rested, regardless of how much sleep she has had.  She has slept as long as 12 hours and did not wake up rested. ? ?Her Past Medical History Is Significant For: ?Past Medical History:  ?Diagnosis Date  ? Allergy   ? ? ?Her Past Surgical History Is Significant For: ?Past Surgical History:  ?Procedure Laterality Date  ? NO PAST SURGERIES    ? ? ?Her Family History Is Significant For: ?Family History  ?Problem Relation Age of Onset  ? Heart disease Father   ? Renal cancer Maternal Grandfather   ? Cancer Maternal Grandfather   ?     kidney  ? ? ?Her Social History Is Significant For: ?Social History  ? ?Socioeconomic History  ? Marital status: Single  ?  Spouse name: Not on file  ? Number of children: Not on file  ? Years of education: Not on file  ? Highest education level: Not on file  ?Occupational History  ? Not on file  ?Tobacco Use  ? Smoking status: Never  ? Smokeless tobacco: Never  ?Vaping Use  ? Vaping Use: Former  ?Substance and Sexual Activity  ? Alcohol use: No  ?  Comment: occ  ? Drug use: No  ? Sexual activity: Not on file  ?Other Topics Concern  ? Not on file  ?Social History  Narrative  ? Caffeine 1-2 cups daily. Energy drink (3 x week).   Work [employer].  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   ? ?Social Determinants of Health  ? ?Financial Resource Strain: Not on file  ?Food Insecurity: Not on file  ?Transportation Needs: Not on file  ?Physical Activity: Not on file  ?Stress: Not on file  ?Social Connections: Not on file  ? ? ?Her Allergies Are:  ?No Known Allergies:  ? ?Her Current Medications Are:  ?Outpatient Encounter Medications as of 05/25/2021  ?Medication Sig  ? FLUoxetine (PROZAC) 40 MG capsule TAKE 1 CAPSULE (40 MG TOTAL) BY MOUTH DAILY.  ? Multiple Vitamin (MULTI VITAMIN DAILY PO) Take by mouth.  ? [DISCONTINUED] Iron, Ferrous Sulfate, 325 (65 Fe) MG TABS Take 325 mg by mouth daily. (Patient  not taking: Reported on 01/27/2021)  ? [DISCONTINUED] nitrofurantoin, macrocrystal-monohydrate, (MACROBID) 100 MG capsule Take 1 capsule (100 mg total) by mouth 2 (two) times daily.  ? ?No facility-administered encounter medications on file as of 05/25/2021.  ?: ? ? ?Review of Systems:  ?Out of a complete 14 point review of systems, all are reviewed and negative with the exception of these symptoms as listed below: ? ?Review of Systems  ?Neurological:   ?     Chronic fatigue, even after sleeping 10-12 hours.  Mild snoring. ESS 14, FSS 52.  ? ?Objective:  ?Neurological Exam ? ?Physical Exam ?Physical Examination:  ? ?Vitals:  ? 05/25/21 0912  ?BP: 113/72  ?Pulse: (!) 102  ? ? ?General Examination: The patient is a very pleasant 26 y.o. female in no acute distress. She appears well-developed and well-nourished and well groomed.  ? ?HEENT: Normocephalic, atraumatic, pupils are equal, round and reactive to light, extraocular tracking is good without limitation to gaze excursion or nystagmus noted. Hearing is grossly intact. Face is symmetric with normal facial animation. Speech is clear with no dysarthria noted. There is no hypophonia. There is no lip, neck/head, jaw or voice tremor. Neck is supple with full range of passive and active motion. There are no carotid bruits on auscultation. Oropharynx exam reveals: No significant mouth dryness, good dental hygiene, Mallampati class I, mild airway crowding secondary to longer uvula and tonsillar size of about 1-2+.  Neck circumference of 14 1/4 inches.  Minimal overbite.  Tongue protrudes centrally and palate elevates symmetrically.  ? ?Chest: Clear to auscultation without wheezing, rhonchi or crackles noted. ? ?Heart: S1+S2+0, regular and normal without murmurs, rubs or gallops noted.  ? ?Abdomen: Soft, non-tender and non-distended. ? ?Extremities: There is no pitting edema in the distal lower extremities bilaterally.  ? ?Skin: Warm and dry without trophic changes noted.   ? ?Musculoskeletal: exam reveals no obvious joint deformities, tenderness or joint swelling or erythema.  ? ?Neurologically:  ?Mental status: The patient is awake, alert and oriented in all 4 spheres. Her immediate and remote memory, attention, language skills and fund of knowledge are appropriate. There is no evidence of aphasia, agnosia, apraxia or anomia. Speech is clear with normal prosody and enunciation. Thought process is linear. Mood is normal and affect is normal.  ?Cranial nerves II - XII are as described above under HEENT exam.  ?Motor exam: Normal bulk, strength and tone is noted. There is no tremor, Romberg is negative. Reflexes are 2+ throughout. Fine motor skills and coordination: grossly intact.  ?Cerebellar testing: No dysmetria or intention tremor. There is no truncal or gait ataxia.  ?Sensory exam: intact to light touch in the upper and lower extremities.  ?  Gait, station and balance: She stands easily. No veering to one side is noted. No leaning to one side is noted. Posture is age-appropriate and stance is narrow based. Gait shows normal stride length and normal pace. No problems turning are noted.  ? ?Assessment and Plan:  ?In summary, DASHONNA CHAGNON is a very pleasant 26 y.o.-year old female with an underlying medical history of allergies, history of cystitis, anxiety, iron deficiency, and mildly overweight state, who presents for evaluation of her hypersomnolence.  Differential diagnosis includes underlying sleep disordered breathing, narcolepsy without cataplexy, idiopathic hypersomnolence, underlying medical condition.  She has had extensive blood work over the past year.  Family history and weight gain as well as snoring does put her at higher risk for obstructive sleep disordered breathing.  We talked about sleep testing quite a bit today.  She is agreeable to proceeding with a sleep study.  We talked about the differences between sleep testing in the lab and at home and extended sleep  testing with MSLT for nap testing.  For extended sleep testing she would have to taper off her Prozac and stay off of it for about 3 weeks prior to testing.  She is agreeable to pursuing sleep apnea evalu

## 2021-06-08 ENCOUNTER — Telehealth: Payer: Self-pay | Admitting: Physical Medicine and Rehabilitation

## 2021-06-08 NOTE — Telephone Encounter (Signed)
Patient called asked if there ois a cancellation can she be worked in. Patient said she was at work yesterday and her back gave out. Patient said she works at [employer] and was sent home yesterday because she could not do her duties. Patient said her employer asked if patient would check to see if she could get the injection sooner.  The number to contact patient is [phone]    ?

## 2021-06-13 ENCOUNTER — Ambulatory Visit: Payer: No Typology Code available for payment source | Admitting: Physical Medicine and Rehabilitation

## 2021-06-13 ENCOUNTER — Ambulatory Visit: Payer: Self-pay

## 2021-06-13 ENCOUNTER — Encounter: Payer: Self-pay | Admitting: Physical Medicine and Rehabilitation

## 2021-06-13 VITALS — BP 125/85 | HR 81

## 2021-06-13 DIAGNOSIS — M5416 Radiculopathy, lumbar region: Secondary | ICD-10-CM

## 2021-06-13 MED ORDER — METHYLPREDNISOLONE ACETATE 80 MG/ML IJ SUSP
80.0000 mg | Freq: Once | INTRAMUSCULAR | Status: AC
  Administered 2021-06-13: 80 mg

## 2021-06-13 NOTE — Progress Notes (Signed)
Pt state lower back pain that travels to her left leg. Pt state walking, standing and laying down makes the pain worse. Pt state she takes over the counter pain meds to help ease her pain. ? ?Numeric Pain Rating Scale and Functional Assessment ?Average Pain 2 ? ? ?In the last MONTH (on 0-10 scale) has pain interfered with the following? ? ?1. General activity like being  able to carry out your everyday physical activities such as walking, climbing stairs, carrying groceries, or moving a chair?  ?Rating(9) ? ? ?+Driver, -BT, -Dye Allergies. ? ?

## 2021-06-13 NOTE — Patient Instructions (Signed)

## 2021-06-22 NOTE — Progress Notes (Signed)
? ?[name] - 26 y.o. female MRN [id number]  Date of birth: [birth year] ? ?Office Visit Note: ?Visit Date: 06/13/2021 ?PCP: Pincus Sanes, MD ?Referred by: Pincus Sanes, MD ? ?Subjective: ?Chief Complaint  ?Patient presents with  ? Lower Back - Pain  ? Left Leg - Pain  ? ?HPI:  Marissa [name] is a 26 y.o. female who comes in today at the request of Dr. Annell Greening for planned Left L5-S1 Lumbar Interlaminar epidural steroid injection with fluoroscopic guidance.  The patient has failed conservative care including home exercise, medications, time and activity modification.  This injection will be diagnostic and hopefully therapeutic.  Please see requesting physician notes for further details and justification. MRI reviewed with images and spine model.  MRI reviewed in the note below.  ? ?ROS Otherwise per HPI. ? ?Assessment & Plan: ?Visit Diagnoses:  ?  ICD-10-CM   ?1. Lumbar radiculopathy  M54.16 XR C-ARM NO REPORT  ?  Epidural Steroid injection  ?  methylPREDNISolone acetate (DEPO-MEDROL) injection 80 mg  ?  ?  ?Plan: No additional findings.  ? ?Meds & Orders:  ?Meds ordered this encounter  ?Medications  ? methylPREDNISolone acetate (DEPO-MEDROL) injection 80 mg  ?  ?Orders Placed This Encounter  ?Procedures  ? XR C-ARM NO REPORT  ? Epidural Steroid injection  ?  ?Follow-up: Return for visit to requesting provider as needed.  ? ?Procedures: ?No procedures performed  ?Lumbar Epidural Steroid Injection - Interlaminar Approach with Fluoroscopic Guidance ? ?Patient: Marissa GODSIL      ?Date of Birth: [birth year] ?MRN: [id number] ?PCP: Pincus Sanes, MD      ?Visit Date: 06/13/2021 ?  ?Universal Protocol:    ? ?Consent Given By: the patient ? ?Position: PRONE ? ?Additional Comments: ?Vital signs were monitored before and after the procedure. ?Patient was prepped and draped in the usual sterile fashion. ?The correct patient, procedure, and site was verified. ? ? ?Injection Procedure Details:  ? ?Procedure  diagnoses: Lumbar radiculopathy [M54.16]  ? ?Meds Administered:  ?Meds ordered this encounter  ?Medications  ? methylPREDNISolone acetate (DEPO-MEDROL) injection 80 mg  ?  ? ?Laterality: Left ? ?Location/Site:  L5-S1 ? ?Needle: 3.5 in., 20 ga. Tuohy ? ?Needle Placement: Paramedian epidural ? ?Findings:  ? -Comments: Excellent flow of contrast into the epidural space. ? ?Procedure Details: ?Using a paramedian approach from the side mentioned above, the region overlying the inferior lamina was localized under fluoroscopic visualization and the soft tissues overlying this structure were infiltrated with 4 ml. of 1% Lidocaine without Epinephrine. The Tuohy needle was inserted into the epidural space using a paramedian approach.  ? ?The epidural space was localized using loss of resistance along with counter oblique bi-planar fluoroscopic views.  After negative aspirate for air, blood, and CSF, a 2 ml. volume of Isovue-250 was injected into the epidural space and the flow of contrast was observed. Radiographs were obtained for documentation purposes.   ? ?The injectate was administered into the level noted above. ? ? ?Additional Comments:  ?No complications occurred ?Dressing: 2 x 2 sterile gauze and Band-Aid ?  ? ?Post-procedure details: ?Patient was observed during the procedure. ?Post-procedure instructions were reviewed. ? ?Patient left the clinic in stable condition.  ? ?Clinical History: ?MRI LUMBAR SPINE WITHOUT CONTRAST ?  ?TECHNIQUE: ?Multiplanar, multisequence MR imaging of the lumbar spine was ?performed. No intravenous contrast was administered. ?  ?COMPARISON:  X-ray 05/12/2020 ?  ?FINDINGS: ?Segmentation:  Standard. ?  ?Alignment:  Physiologic. ?  ?  Vertebrae:  No fracture, evidence of discitis, or bone lesion. ?  ?Conus medullaris and cauda equina: Conus extends to the L1-2 level. ?Conus and cauda equina appear normal. ?  ?Paraspinal and other soft tissues: Negative. ?  ?Disc levels: ?  ?T12-L1:  Unremarkable. ?  ?L1-L2: Unremarkable. ?  ?L2-L3: Unremarkable. ?  ?L3-L4: Unremarkable. ?  ?L4-L5: Unremarkable. ?  ?L5-S1: Disc desiccation with shallow disc protrusion, slightly ?eccentric to the left. There is slight narrowing of the bilateral ?subarticular recesses without evidence of neural impingement. Mild ?bilateral neural foraminal narrowing, left slightly greater than ?right. No canal stenosis. ?  ?IMPRESSION: ?1. Mild degenerative disc disease at L5-S1 with mild bilateral ?neural foraminal narrowing, left slightly greater than right. Mild ?bilateral subarticular recess stenosis without canal stenosis. ?2. Remaining levels are normal in appearance. ?  ?  ?Electronically Signed ?  By: Duanne Guess D.O. ?  On: 10/15/2020 16:45  ? ? ? ?Objective:  VS:  HT:    WT:   BMI:     BP:125/85  HR:81bpm  TEMP: ( )  RESP:  ?Physical Exam ?Vitals and nursing note reviewed.  ?Constitutional:   ?   General: She is not in acute distress. ?   Appearance: Normal appearance. She is not ill-appearing.  ?HENT:  ?   Head: Normocephalic and atraumatic.  ?   Right Ear: External ear normal.  ?   Left Ear: External ear normal.  ?Eyes:  ?   Extraocular Movements: Extraocular movements intact.  ?Cardiovascular:  ?   Rate and Rhythm: Normal rate.  ?   Pulses: Normal pulses.  ?Pulmonary:  ?   Effort: Pulmonary effort is normal. No respiratory distress.  ?Abdominal:  ?   General: There is no distension.  ?   Palpations: Abdomen is soft.  ?Musculoskeletal:     ?   General: Tenderness present.  ?   Cervical back: Neck supple.  ?   Right lower leg: No edema.  ?   Left lower leg: No edema.  ?   Comments: Patient has good distal strength with no pain over the greater trochanters.  No clonus or focal weakness.  ?Skin: ?   Findings: No erythema, lesion or rash.  ?Neurological:  ?   General: No focal deficit present.  ?   Mental Status: She is alert and oriented to person, place, and time.  ?   Sensory: No sensory deficit.  ?   Motor:  No weakness or abnormal muscle tone.  ?   Coordination: Coordination normal.  ?Psychiatric:     ?   Mood and Affect: Mood normal.     ?   Behavior: Behavior normal.  ?  ? ?Imaging: ?No results found. ?

## 2021-06-22 NOTE — Procedures (Signed)
Lumbar Epidural Steroid Injection - Interlaminar Approach with Fluoroscopic Guidance ? ?Patient: Marissa [name]      ?Date of Birth: [birth year] ?MRN: [id number] ?PCP: Pincus Sanes, MD      ?Visit Date: 06/13/2021 ?  ?Universal Protocol:    ? ?Consent Given By: the patient ? ?Position: PRONE ? ?Additional Comments: ?Vital signs were monitored before and after the procedure. ?Patient was prepped and draped in the usual sterile fashion. ?The correct patient, procedure, and site was verified. ? ? ?Injection Procedure Details:  ? ?Procedure diagnoses: Lumbar radiculopathy [M54.16]  ? ?Meds Administered:  ?Meds ordered this encounter  ?Medications  ? methylPREDNISolone acetate (DEPO-MEDROL) injection 80 mg  ?  ? ?Laterality: Left ? ?Location/Site:  L5-S1 ? ?Needle: 3.5 in., 20 ga. Tuohy ? ?Needle Placement: Paramedian epidural ? ?Findings:  ? -Comments: Excellent flow of contrast into the epidural space. ? ?Procedure Details: ?Using a paramedian approach from the side mentioned above, the region overlying the inferior lamina was localized under fluoroscopic visualization and the soft tissues overlying this structure were infiltrated with 4 ml. of 1% Lidocaine without Epinephrine. The Tuohy needle was inserted into the epidural space using a paramedian approach.  ? ?The epidural space was localized using loss of resistance along with counter oblique bi-planar fluoroscopic views.  After negative aspirate for air, blood, and CSF, a 2 ml. volume of Isovue-250 was injected into the epidural space and the flow of contrast was observed. Radiographs were obtained for documentation purposes.   ? ?The injectate was administered into the level noted above. ? ? ?Additional Comments:  ?No complications occurred ?Dressing: 2 x 2 sterile gauze and Band-Aid ?  ? ?Post-procedure details: ?Patient was observed during the procedure. ?Post-procedure instructions were reviewed. ? ?Patient left the clinic in stable condition. ?

## 2021-06-23 ENCOUNTER — Telehealth: Payer: Self-pay

## 2021-06-23 NOTE — Telephone Encounter (Signed)
LVM for pt to call me back to schedule sleep study  

## 2021-06-28 ENCOUNTER — Ambulatory Visit: Payer: No Typology Code available for payment source | Admitting: Orthopaedic Surgery

## 2021-06-28 ENCOUNTER — Encounter: Payer: No Typology Code available for payment source | Admitting: Physical Medicine and Rehabilitation

## 2021-06-28 ENCOUNTER — Encounter: Payer: Self-pay | Admitting: Orthopaedic Surgery

## 2021-06-28 VITALS — BP 118/77 | Ht 69.0 in | Wt 198.0 lb

## 2021-06-28 DIAGNOSIS — M5126 Other intervertebral disc displacement, lumbar region: Secondary | ICD-10-CM | POA: Diagnosis not present

## 2021-06-28 MED ORDER — GABAPENTIN 100 MG PO CAPS: 100.0000 mg | ORAL_CAPSULE | Freq: Two times a day (BID) | ORAL | 1 refills | Status: DC

## 2021-07-05 NOTE — Progress Notes (Signed)
? ?Office Visit Note ?  ?Patient: Marissa [name]           ?Date of Birth: Jun 25, 1995           ?MRN: [id number] ?Visit Date: 06/28/2021 ?             ?Requested by: Pincus Sanes, MD ?[address] ?[place],  [place] [address] ?PCP: Pincus Sanes, MD ? ? ?Assessment & Plan: ?Visit Diagnoses:  ?1. Protrusion of lumbar intervertebral disc   ? ? ?Plan: We reviewed images she does not have significant compression but does have some disc degeneration L5-S1.  There is mild foraminal narrowing.  We will start her on some gabapentin low-dose and gradually increase.  Recheck 3 weeks. ? ?Follow-Up Instructions: Return in about 3 weeks (around 07/19/2021).  ? ?Orders:  ?No orders of the defined types were placed in this encounter. ? ?Meds ordered this encounter  ?Medications  ? gabapentin (NEURONTIN) 100 MG capsule  ?  Sig: Take 1 capsule (100 mg total) by mouth 2 (two) times daily.  ?  Dispense:  60 capsule  ?  Refill:  1  ? ? ? ? Procedures: ?No procedures performed ? ? ?Clinical Data: ?No additional findings. ? ? ?Subjective: ?Chief Complaint  ?Patient presents with  ? Lower Back - Pain  ? ? ?HPI 6-week follow-up for 26 year old female with low back pain.  She had an epidural 06/13/2021 and states maybe she got 1 day without relief and then recurrence.  She states originally after the injection she had pain on her left nose worse on the right.  She is using ibuprofen regularly.  No associated bowel or bladder symptoms.  MRI scan 10/15/2020 showed mild degenerative changes at L5-S1 with mild bilateral neuroforaminal narrowing.  All other levels and lumbar spine were normal.  She had slight disc desiccation at L5-S1. ? ?Review of Systems all the systems update noncontributory to HPI.  Negative for chills or fever no bowel bladder associated symptoms. ? ? ?Objective: ?Vital Signs: BP 118/77   Ht 5\' 9"  (1.753 m)   Wt 198 lb (89.8 kg)   BMI 29.24 kg/m?  ? ?Physical Exam ?Constitutional:   ?   Appearance: She is  well-developed.  ?HENT:  ?   Head: Normocephalic.  ?   Right Ear: External ear normal.  ?   Left Ear: External ear normal. There is no impacted cerumen.  ?Eyes:  ?   Pupils: Pupils are equal, round, and reactive to light.  ?Neck:  ?   Thyroid: No thyromegaly.  ?   Trachea: No tracheal deviation.  ?Cardiovascular:  ?   Rate and Rhythm: Normal rate.  ?Pulmonary:  ?   Effort: Pulmonary effort is normal.  ?Abdominal:  ?   Palpations: Abdomen is soft.  ?Musculoskeletal:  ?   Cervical back: No rigidity.  ?Skin: ?   General: Skin is warm and dry.  ?Neurological:  ?   Mental Status: She is alert and oriented to person, place, and time.  ?Psychiatric:     ?   Behavior: Behavior normal.  ? ? ?Ortho Exam negative straight leg raising.  She has some sciatic notch tenderness no trochanteric bursal tenderness negative logroll hips she is able to toe and heel walk normally no isolated weakness lower extremities.  Normal sensation. ? ?Specialty Comments:  ?MRI LUMBAR SPINE WITHOUT CONTRAST ?  ?TECHNIQUE: ?Multiplanar, multisequence MR imaging of the lumbar spine was ?performed. No intravenous contrast was administered. ?  ?COMPARISON:  X-ray 05/12/2020 ?  ?FINDINGS: ?Segmentation:  Standard. ?  ?Alignment:  Physiologic. ?  ?Vertebrae:  No fracture, evidence of discitis, or bone lesion. ?  ?Conus medullaris and cauda equina: Conus extends to the L1-2 level. ?Conus and cauda equina appear normal. ?  ?Paraspinal and other soft tissues: Negative. ?  ?Disc levels: ?  ?T12-L1: Unremarkable. ?  ?L1-L2: Unremarkable. ?  ?L2-L3: Unremarkable. ?  ?L3-L4: Unremarkable. ?  ?L4-L5: Unremarkable. ?  ?L5-S1: Disc desiccation with shallow disc protrusion, slightly ?eccentric to the left. There is slight narrowing of the bilateral ?subarticular recesses without evidence of neural impingement. Mild ?bilateral neural foraminal narrowing, left slightly greater than ?right. No canal stenosis. ?  ?IMPRESSION: ?1. Mild degenerative disc disease at L5-S1  with mild bilateral ?neural foraminal narrowing, left slightly greater than right. Mild ?bilateral subarticular recess stenosis without canal stenosis. ?2. Remaining levels are normal in appearance. ?  ?  ?Electronically Signed ?  By: Duanne Guess D.O. ?  On: 10/15/2020 16:45 ? ?Imaging: ?No results found. ? ? ?PMFS History: ?Patient Active Problem List  ? Diagnosis Date Noted  ? Protrusion of lumbar intervertebral disc 05/24/2021  ? Acute cystitis with hematuria 01/27/2021  ? Dysuria 01/27/2021  ? COVID-19 virus infection 10/27/2020  ? Iron deficiency 06/17/2020  ? Pain of left lower extremity 06/04/2020  ? Chest pain 03/04/2020  ? Menorrhagia with regular cycle 01/01/2020  ? Fatigue 12/31/2019  ? Knee pain 09/13/2018  ? Anxiety 12/21/2015  ? ?Past Medical History:  ?Diagnosis Date  ? Allergy   ?  ?Family History  ?Problem Relation Age of Onset  ? Heart disease Father   ? Renal cancer Maternal Grandfather   ? Cancer Maternal Grandfather   ?     kidney  ?  ?Past Surgical History:  ?Procedure Laterality Date  ? NO PAST SURGERIES    ? ?Social History  ? ?Occupational History  ? Not on file  ?Tobacco Use  ? Smoking status: Never  ? Smokeless tobacco: Never  ?Vaping Use  ? Vaping Use: Former  ?Substance and Sexual Activity  ? Alcohol use: No  ?  Comment: occ  ? Drug use: No  ? Sexual activity: Not on file  ? ? ? ? ? ? ?

## 2021-07-07 ENCOUNTER — Ambulatory Visit (INDEPENDENT_AMBULATORY_CARE_PROVIDER_SITE_OTHER): Payer: No Typology Code available for payment source | Admitting: Neurology

## 2021-07-07 DIAGNOSIS — Z820 Family history of epilepsy and other diseases of the nervous system: Secondary | ICD-10-CM

## 2021-07-07 DIAGNOSIS — R351 Nocturia: Secondary | ICD-10-CM

## 2021-07-07 DIAGNOSIS — G4719 Other hypersomnia: Secondary | ICD-10-CM | POA: Diagnosis not present

## 2021-07-07 DIAGNOSIS — G4720 Circadian rhythm sleep disorder, unspecified type: Secondary | ICD-10-CM

## 2021-07-07 DIAGNOSIS — R519 Headache, unspecified: Secondary | ICD-10-CM

## 2021-07-07 DIAGNOSIS — G478 Other sleep disorders: Secondary | ICD-10-CM

## 2021-07-07 DIAGNOSIS — R0683 Snoring: Secondary | ICD-10-CM

## 2021-07-07 DIAGNOSIS — R635 Abnormal weight gain: Secondary | ICD-10-CM

## 2021-07-18 NOTE — Procedures (Signed)
PATIENT'S NAME:  Marissa [name], Marissa [name] DOB:      [birth year]      MR#:    [id number]     DATE OF RECORDING: 07/07/2021 REFERRING M.D.:  Cheryll Cockayne, MD Study Performed:   Baseline Polysomnogram HISTORY:  26 year old right-handed woman with an underlying medical history of allergies, history of cystitis, anxiety, iron deficiency, and mildly overweight state, who reports an approximately 1-1/2-year history of excessive daytime somnolence, nonrestorative sleep, and lack of energy. The patient endorsed the Epworth Sleepiness Scale at 14 points. The patient's weight 198 pounds with a height of 69 (inches), resulting in a BMI of 29.4 kg/m2. The patient's neck circumference measured 14.2 inches.  CURRENT MEDICATIONS: Prozac, Multivitamins   PROCEDURE:  This is a multichannel digital polysomnogram utilizing the Somnostar 11.2 system.  Electrodes and sensors were applied and monitored per AASM Specifications.   EEG, EOG, Chin and Limb EMG, were sampled at 200 Hz.  ECG, Snore and Nasal Pressure, Thermal Airflow, Respiratory Effort, CPAP Flow and Pressure, Oximetry was sampled at 50 Hz. Digital video and audio were recorded.      BASELINE STUDY  Lights Out was at 20:24 and Lights On at 05:00.  Total recording time (TRT) was 516 minutes, with a total sleep time (TST) of 476 minutes.   The patient's sleep latency was 22.5 minutes.  REM latency was 189.5 minutes, which is delayed. The sleep efficiency was 92.2 %.     SLEEP ARCHITECTURE: WASO (Wake after sleep onset) was 17 minutes without significant sleep fragmentation noted.  There were 8 minutes in Stage N1, 301.5 minutes Stage N2, 64.5 minutes Stage N3 and 102 minutes in Stage REM.  The percentage of Stage N1 was 1.7%, Stage N2 was 63.3%, which is mildly increased, Stage N3 was 13.6%, which is near normal and Stage R (REM sleep) was 21.4%, which is normal.  The arousals were noted as: 59 were spontaneous, 1 were associated with PLMs, 2 were associated with respiratory  events.  RESPIRATORY ANALYSIS:  There were a total of 7 respiratory events:  0 obstructive apneas, 0 central apneas and 1 mixed apneas with a total of 1 apneas and an apnea index (AI) of .1 /hour. There were 6 hypopneas with a hypopnea index of .8 /hour. The patient also had 0 respiratory event related arousals (RERAs).      The total APNEA/HYPOPNEA INDEX (AHI) was .9/hour and the total RESPIRATORY DISTURBANCE INDEX was  .9 /hour.  1 events occurred in REM sleep and 12 events in NREM. The REM AHI was  .6 /hour, versus a non-REM AHI of 1.. The patient spent 170.5 minutes of total sleep time in the supine position and 306 minutes in non-supine.. The supine AHI was 2.5 versus a non-supine AHI of 0.0.  OXYGEN SATURATION & C02:  The Wake baseline 02 saturation was 97%, with the lowest being 86% (not associated with a respiratory events). Time spent below 89% saturation equaled 3 minutes, which may be overestimated, due to sensor dislodged due to movement.  PERIODIC LIMB MOVEMENTS: The patient had a total of 5 Periodic Limb Movements.  The Periodic Limb Movement (PLM) index was .6 and the PLM Arousal index was .1/hour.  Audio and video analysis did not show any abnormal or unusual movements, behaviors, phonations or vocalizations. The patient took no bathroom breaks. Mild snoring was noted. The EKG was in keeping with normal sinus rhythm (NSR).  Post-study, the patient indicated that sleep was the same as usual.   IMPRESSION:  Primary Snoring Dysfunctions associated with sleep stages or arousal from sleep  RECOMMENDATIONS:  This study does not demonstrate any significant obstructive or central sleep disordered breathing with the exception of mild snoring. Treatment with a positive airway pressure device, such as CPAP or autoPAP is not indicated; total AHI was less than 1/hour. This study does not support an intrinsic sleep disorder as a cause of the patient's symptoms. Other causes, including  circadian rhythm disturbances, an underlying mood disorder, medication effect and/or an underlying medical problem cannot be ruled out. This study shows no significant sleep fragmentation, good sleep efficiency and very mildly abnormal sleep stage percentages; these are nonspecific findings and per se do not signify an intrinsic sleep disorder or a cause for the patient's sleep-related symptoms. Causes include (but are not limited to) the first night effect of the sleep study, circadian rhythm disturbances, medication effect or an underlying mood disorder or medical problem.  The patient should be cautioned not to drive, work at heights, or operate dangerous or heavy equipment when tired or sleepy. Review and reiteration of good sleep hygiene measures should be pursued with any patient. The patient will be advised to follow up with the referring provider, who will be notified of the test results.  I certify that I have reviewed the entire raw data recording prior to the issuance of this report in accordance with the Standards of Accreditation of the American Academy of Sleep Medicine (AASM)  Huston Foley, MD, PhD Diplomat, American Board of Neurology and Sleep Medicine (Neurology and Sleep Medicine)

## 2021-07-19 ENCOUNTER — Telehealth: Payer: Self-pay | Admitting: *Deleted

## 2021-07-19 ENCOUNTER — Telehealth: Payer: Self-pay

## 2021-07-19 ENCOUNTER — Ambulatory Visit: Payer: No Typology Code available for payment source | Admitting: Orthopaedic Surgery

## 2021-07-19 NOTE — Telephone Encounter (Signed)
-----   Message from Star Age, MD sent at 07/18/2021  5:40 PM EDT ----- Patient referred by Dr. Quay Burow, seen by me on 05/25/21, diagnostic PSG on 07/07/21.   Please call and notify the patient that the recent sleep study did not show any significant obstructive sleep apnea; she had mild snoring, slept over 90% of the time tested and achieved all stages of sleep. No need for CPAP therapy. She can follow up with her her PCP as scheduled/planned. Thanks,  Star Age, MD, PhD [facility])

## 2021-07-19 NOTE — Telephone Encounter (Signed)
I called pt and relayed results of sleep study.  No OSA, no need for cpap.  Go back to pcp as scheduled/planned.

## 2021-07-19 NOTE — Telephone Encounter (Signed)
-----   Message from Saima Athar, MD sent at 07/18/2021  5:40 PM EDT ----- Patient referred by Dr. Burns, seen by me on 05/25/21, diagnostic PSG on 07/07/21.   Please call and notify the patient that the recent sleep study did not show any significant obstructive sleep apnea; she had mild snoring, slept over 90% of the time tested and achieved all stages of sleep. No need for CPAP therapy. She can follow up with her her PCP as scheduled/planned. Thanks,  Saima Athar, MD, PhD [facility] ([facility])  

## 2021-07-19 NOTE — Telephone Encounter (Signed)
I called patient to discuss. No answer, left a message asking her to call us back. If patient calls back another day please route to POD 4. ?

## 2021-09-18 ENCOUNTER — Encounter: Payer: Self-pay | Admitting: Internal Medicine

## 2021-09-18 DIAGNOSIS — R635 Abnormal weight gain: Secondary | ICD-10-CM | POA: Insufficient documentation

## 2021-09-18 NOTE — Progress Notes (Signed)
Subjective:    Patient ID: Marissa [name], female    DOB: January 22, 1996, 26 y.o.   MRN: [id number]      HPI Dedra is here for  Chief Complaint  Patient presents with   Weight Gain   She saw her gynecologist recently and they advised following up in ruling out the possibility of because of weight gain  Sudden weight gain - since January gained 20 + lbs.  She is exercising more and has not changed her diet.  She monitors her weight at home.  She feels like her weight jumped up at 1 point and then at another point jumped up a lot.  Nothing changed and she is not sure why this happened.  Had sleep study-no OSA.  Feels exhausted for no reason.  Goes to bed at 9 and up at 7. She feels like her sleep is good but wakes up tired.  If she does have to get up to go to the bathroom she is able to go back to sleep.   Exercise - goes to gym - does cardio and lifts weights.  Does 3-4 times a week.     Tries to limit carbs and does not eat fast food - no changes in last six months;   Took gabapentin for one week only - no longer taking it.   Increased stress over the past 6 months - no panic attacks and manageable.      Medications and allergies reviewed with patient and updated if appropriate.  Current Outpatient Medications on File Prior to Visit  Medication Sig Dispense Refill   FLUoxetine (PROZAC) 40 MG capsule TAKE 1 CAPSULE (40 MG TOTAL) BY MOUTH DAILY. 90 capsule 1   gabapentin (NEURONTIN) 100 MG capsule Take 1 capsule (100 mg total) by mouth 2 (two) times daily. 60 capsule 1   levonorgestrel (KYLEENA) 19.5 MG IUD Take 1 device by intrauterine route.     Multiple Vitamin (MULTI VITAMIN DAILY PO) Take by mouth.     No current facility-administered medications on file prior to visit.    Review of Systems  Constitutional:  Positive for fatigue. Negative for chills and fever.  Respiratory:  Negative for cough, shortness of breath and wheezing.   Cardiovascular:  Positive for leg  swelling (with prolonged standing). Negative for chest pain and palpitations.  Gastrointestinal:  Negative for abdominal pain, constipation, diarrhea and nausea.  Endocrine: Positive for polydipsia. Negative for polyuria.  Genitourinary:  Positive for frequency. Negative for dysuria and hematuria.       Urine odor - sometimes  Neurological:  Positive for headaches (intermittent - not new). Negative for light-headedness.       Objective:   Vitals:   09/19/21 1010  BP: 112/78  Pulse: 82  Temp: 98.6 F (37 C)  SpO2: 97%   BP Readings from Last 3 Encounters:  09/19/21 112/78  06/28/21 118/77  06/13/21 125/85   Wt Readings from Last 3 Encounters:  09/19/21 207 lb (93.9 kg)  06/28/21 198 lb (89.8 kg)  05/25/21 198 lb 3.2 oz (89.9 kg)   Body mass index is 30.57 kg/m.    Physical Exam Constitutional:      General: She is not in acute distress.    Appearance: Normal appearance.  HENT:     Head: Normocephalic and atraumatic.  Eyes:     Conjunctiva/sclera: Conjunctivae normal.  Cardiovascular:     Rate and Rhythm: Normal rate and regular rhythm.     Heart sounds:  Normal heart sounds. No murmur heard. Pulmonary:     Effort: Pulmonary effort is normal. No respiratory distress.     Breath sounds: Normal breath sounds. No wheezing.  Musculoskeletal:     Cervical back: Neck supple.     Right lower leg: No edema.     Left lower leg: No edema.  Lymphadenopathy:     Cervical: No cervical adenopathy.  Skin:    General: Skin is warm and dry.     Findings: No rash.  Neurological:     Mental Status: She is alert. Mental status is at baseline.  Psychiatric:        Mood and Affect: Mood normal.        Behavior: Behavior normal.            Assessment & Plan:    See Problem List for Assessment and Plan of chronic medical problems.    I spent 30 minutes dedicated to the care of this patient on the date of this encounter including review of last labs, sleep study,  speciality notes -neurology and gyn, obtaining history, communicating with the patient, ordering medications, tests, and documenting clinical information in the EHR

## 2021-09-19 ENCOUNTER — Ambulatory Visit: Payer: No Typology Code available for payment source | Admitting: Internal Medicine

## 2021-09-19 VITALS — BP 112/78 | HR 82 | Temp 98.6°F | Ht 69.0 in | Wt 207.0 lb

## 2021-09-19 DIAGNOSIS — R350 Frequency of micturition: Secondary | ICD-10-CM | POA: Diagnosis not present

## 2021-09-19 DIAGNOSIS — F419 Anxiety disorder, unspecified: Secondary | ICD-10-CM

## 2021-09-19 DIAGNOSIS — R635 Abnormal weight gain: Secondary | ICD-10-CM

## 2021-09-19 DIAGNOSIS — R5382 Chronic fatigue, unspecified: Secondary | ICD-10-CM | POA: Diagnosis not present

## 2021-09-19 LAB — CBC WITH DIFFERENTIAL/PLATELET
Basophils Absolute: 0.1 10*3/uL (ref 0.0–0.1)
Basophils Relative: 0.7 % (ref 0.0–3.0)
Eosinophils Absolute: 0.2 10*3/uL (ref 0.0–0.7)
Eosinophils Relative: 3.2 % (ref 0.0–5.0)
HCT: 42 % (ref 36.0–46.0)
Hemoglobin: 14.1 g/dL (ref 12.0–15.0)
Lymphocytes Relative: 28.1 % (ref 12.0–46.0)
Lymphs Abs: 2.2 10*3/uL (ref 0.7–4.0)
MCHC: 33.6 g/dL (ref 30.0–36.0)
MCV: 84.5 fl (ref 78.0–100.0)
Monocytes Absolute: 0.6 10*3/uL (ref 0.1–1.0)
Monocytes Relative: 7.6 % (ref 3.0–12.0)
Neutro Abs: 4.7 10*3/uL (ref 1.4–7.7)
Neutrophils Relative %: 60.4 % (ref 43.0–77.0)
Platelets: 326 10*3/uL (ref 150.0–400.0)
RBC: 4.96 Mil/uL (ref 3.87–5.11)
RDW: 12.5 % (ref 11.5–15.5)
WBC: 7.8 10*3/uL (ref 4.0–10.5)

## 2021-09-19 LAB — URINALYSIS, ROUTINE W REFLEX MICROSCOPIC
Bilirubin Urine: NEGATIVE
Hgb urine dipstick: NEGATIVE
Ketones, ur: NEGATIVE
Leukocytes,Ua: NEGATIVE
Nitrite: NEGATIVE
RBC / HPF: NONE SEEN (ref 0–?)
Specific Gravity, Urine: 1.02 (ref 1.000–1.030)
Total Protein, Urine: NEGATIVE
Urine Glucose: NEGATIVE
Urobilinogen, UA: 0.2 (ref 0.0–1.0)
pH: 6 (ref 5.0–8.0)

## 2021-09-19 LAB — COMPREHENSIVE METABOLIC PANEL
ALT: 26 U/L (ref 0–35)
AST: 24 U/L (ref 0–37)
Albumin: 4.6 g/dL (ref 3.5–5.2)
Alkaline Phosphatase: 76 U/L (ref 39–117)
BUN: 13 mg/dL (ref 6–23)
CO2: 27 mEq/L (ref 19–32)
Calcium: 9.6 mg/dL (ref 8.4–10.5)
Chloride: 102 mEq/L (ref 96–112)
Creatinine, Ser: 0.82 mg/dL (ref 0.40–1.20)
GFR: 100.07 mL/min (ref >60.00)
Glucose, Bld: 73 mg/dL (ref 70–99)
Potassium: 3.7 mEq/L (ref 3.5–5.1)
Sodium: 136 mEq/L (ref 135–145)
Total Bilirubin: 0.3 mg/dL (ref 0.2–1.2)
Total Protein: 7.2 g/dL (ref 6.0–8.3)

## 2021-09-19 LAB — TSH: TSH: 1.57 u[IU]/mL (ref 0.35–5.50)

## 2021-09-19 LAB — HEMOGLOBIN A1C: Hgb A1c MFr Bld: 5.2 % (ref 4.6–6.5)

## 2021-09-19 NOTE — Assessment & Plan Note (Signed)
New Over the past 6 months she has gained approximately 20 pounds based on her home scale She feels like she had a sudden increase in weight in 2 different times during the past 6 months and is not sure why No change in diet or exercise-she is actually exercising more We will check CBC, CMP tSH, A1c Further evaluation and work-up based on above Would be a good idea for her to do some calorie counting to get an idea of how many calories she is consuming

## 2021-09-19 NOTE — Assessment & Plan Note (Signed)
Chronic She is good and days and bad days-fairly controlled Discussed options of increasing fluoxetine or changing to a different medication and at this time she does not want to make any changes, but will think about it Continue fluoxetine 40 mg daily Continue regular exercise

## 2021-09-19 NOTE — Patient Instructions (Addendum)
     Blood work was ordered.     Medications changes include :   none     No follow-ups on file.

## 2021-09-19 NOTE — Assessment & Plan Note (Signed)
Chronic Continues to have fatigue Sleeping a good amount of hours and she feels like she is getting good quality sleep.  Sleep study showed no sleep apnea or sleep issues Will recheck CBC, CMP, TSH

## 2021-09-20 LAB — URINE CULTURE: Result:: NO GROWTH

## 2021-10-26 ENCOUNTER — Other Ambulatory Visit: Payer: Self-pay | Admitting: Internal Medicine

## 2022-02-13 ENCOUNTER — Encounter: Payer: Self-pay | Admitting: Internal Medicine

## 2022-02-13 NOTE — Progress Notes (Unsigned)
    Subjective:    Patient ID: Marissa [name], female    DOB: Feb 07, 1996, 26 y.o.   MRN: [id number]      HPI Marissa [name] is here for No chief complaint on file.    Back pain -     Medications and allergies reviewed with patient and updated if appropriate.  Current Outpatient Medications on File Prior to Visit  Medication Sig Dispense Refill   FLUoxetine (PROZAC) 40 MG capsule TAKE 1 CAPSULE (40 MG TOTAL) BY MOUTH DAILY. 90 capsule 1   levonorgestrel (KYLEENA) 19.5 MG IUD Take 1 device by intrauterine route.     Multiple Vitamin (MULTI VITAMIN DAILY PO) Take by mouth.     No current facility-administered medications on file prior to visit.    Review of Systems     Objective:  There were no vitals filed for this visit. BP Readings from Last 3 Encounters:  09/19/21 112/78  06/28/21 118/77  06/13/21 125/85   Wt Readings from Last 3 Encounters:  09/19/21 207 lb (93.9 kg)  06/28/21 198 lb (89.8 kg)  05/25/21 198 lb 3.2 oz (89.9 kg)   There is no height or weight on file to calculate BMI.    Physical Exam         Assessment & Plan:    See Problem List for Assessment and Plan of chronic medical problems.

## 2022-02-14 ENCOUNTER — Ambulatory Visit: Payer: No Typology Code available for payment source | Admitting: Internal Medicine

## 2022-02-14 VITALS — BP 108/74 | HR 97 | Temp 98.0°F | Ht 69.0 in | Wt 209.0 lb

## 2022-02-14 DIAGNOSIS — G8929 Other chronic pain: Secondary | ICD-10-CM | POA: Diagnosis not present

## 2022-02-14 DIAGNOSIS — M545 Low back pain, unspecified: Secondary | ICD-10-CM | POA: Diagnosis not present

## 2022-02-14 DIAGNOSIS — F3289 Other specified depressive episodes: Secondary | ICD-10-CM | POA: Diagnosis not present

## 2022-02-14 DIAGNOSIS — F32A Depression, unspecified: Secondary | ICD-10-CM | POA: Insufficient documentation

## 2022-02-14 DIAGNOSIS — F419 Anxiety disorder, unspecified: Secondary | ICD-10-CM | POA: Diagnosis not present

## 2022-02-14 DIAGNOSIS — Z23 Encounter for immunization: Secondary | ICD-10-CM

## 2022-02-14 MED ORDER — FLUOXETINE HCL 20 MG PO CAPS: ORAL_CAPSULE | ORAL | 0 refills | Status: DC

## 2022-02-14 MED ORDER — VENLAFAXINE HCL ER 37.5 MG PO CP24: 37.5000 mg | ORAL_CAPSULE | Freq: Every day | ORAL | 5 refills | Status: DC

## 2022-02-14 NOTE — Assessment & Plan Note (Signed)
Chronic since 2021 Back pain is intermittent When the pain comes it can last a day or week Currently pain-free and can be pain-free after 1 month, but pain is frequent Has pain in lower back and sometimes has right-sided sciatica Had MRI couple of years ago, 1 injection and PT-injection and PT did not help Currently taking Advil as needed-1000 mg at a time which does not seem to help, heating pad does help  Currently pain-free so no treatment needed Will refer to sports medicine for further evaluation/treatment

## 2022-02-14 NOTE — Assessment & Plan Note (Signed)
Chronic Both anxiety and depression not ideally controlled Think she needs medication adjusted Currently on fluoxetine 40 mg daily Discussed options Will taper off fluoxetine-decrease to 20 mg daily for 1 week and then 20 mg every other day for 1 week then stop Start Effexor 37.5 mg daily Follow-up in 2 months, sooner if needed

## 2022-02-14 NOTE — Patient Instructions (Addendum)
        Medications changes include :   decrease fluoxetine to 20 mg daily x 1 week then every other day for one week then stop it.  Start the effexor 37.5 mg     A referral was ordered for sports medicine.     Someone will call you to schedule an appointment.    Return in about 2 months (around 04/17/2022) for follow up.

## 2022-02-14 NOTE — Assessment & Plan Note (Signed)
Chronic Somewhat controlled, depression not ideally controlled Think she needs medication adjusted Currently on fluoxetine 40 mg daily Discussed options Will taper off fluoxetine-decrease to 20 mg daily for 1 week and then 20 mg every other day for 1 week then stop Start Effexor 37.5 mg daily Follow-up in 2 months, sooner if needed

## 2022-03-02 NOTE — Progress Notes (Signed)
Marissa [name] D.[place] Phone: [phone]   Assessment and Plan:     1. Chronic bilateral low back pain without sciatica 2. Chronic left SI joint pain 3. Chronic right SI joint pain 4. Somatic dysfunction of lumbar region 5. Somatic dysfunction of pelvic region 6. Somatic dysfunction of sacral region -Chronic with exacerbation, initial sports medicine visit - Chronic low back pain most consistent with lumbosacral muscular strains and SI joint dysfunction based on HPI and physical exam - Patient has trialed multiple treatment modalities in the past with minimal to no improvement including intermittent course of NSAIDs, physical therapy, gabapentin, lumbar epidural CSI.  Heating pad over low back has provided more relief than any of these treatments thus far - MRI shows only mild degenerative changes at L5-S1 without impingement - Start meloxicam 15 mg daily x2 weeks.  If still having pain after 2 weeks, complete 3rd-week of meloxicam. May use remaining meloxicam as needed once daily for pain control.  Do not to use additional NSAIDs while taking meloxicam.  May use Tylenol [phone] mg 2 to 3 times a day for breakthrough pain. - Start Flexeril 5 to 10 mg nightly as needed for muscle spasms - Start HEP for low back, sacrum - Patient elected for initial OMT today.  Tolerated well per note below. - Decision today to treat with OMT was based on Physical Exam  After verbal consent patient was treated with HVLA (high velocity low amplitude), ME (muscle energy), FPR (flex positional release), ST (soft tissue), PC/PD (Pelvic Compression/ Pelvic Decompression) techniques in sacrum, lumbar, and pelvic areas. Patient tolerated the procedure well with improvement in symptoms.  Patient educated on potential side effects of soreness and recommended to rest, hydrate, and use Tylenol as needed for pain control.  Other orders -  meloxicam (MOBIC) 15 MG tablet; Take 1 tablet (15 mg total) by mouth daily. - cyclobenzaprine (FLEXERIL) 5 MG tablet; Take 1 tablet (5 mg total) by mouth at bedtime.    Pertinent previous records reviewed include lumbar MRI 10/15/2020   Follow Up: 3 to 4 weeks for reevaluation.  If no improvement or worsening of symptoms, could consider SI joint CSI.  Could consider repeat OMT if patient found today's treatment beneficial   Subjective:   I, Marissa [name], am serving as a Education administrator for Doctor Glennon Mac  Chief Complaint: low back pain   HPI:   03/03/2022 Patient is a 27 year old female complaining of low back pain. Patient states that a year and a half ago she was dx with DDD, till has pain after PT , epidural did not help a year ago, pain is not improving , has pain walking and standing up straight, does get sciatic symptoms , advil does not help anymore gabapentin doenst work , heating pad is the only thing that really helps   Relevant Historical Information: None pertinent  Additional pertinent review of systems negative.   Current Outpatient Medications:    cyclobenzaprine (FLEXERIL) 5 MG tablet, Take 1 tablet (5 mg total) by mouth at bedtime., Disp: 30 tablet, Rfl: 0   FLUoxetine (PROZAC) 20 MG capsule, Take 20 mg daily x 1 week, then take 20 mg QOD x 1 week then stop, Disp: 11 capsule, Rfl: 0   levonorgestrel (KYLEENA) 19.5 MG IUD, Take 1 device by intrauterine route., Disp: , Rfl:    meloxicam (MOBIC) 15 MG tablet, Take 1 tablet (15 mg total) by mouth  daily., Disp: 30 tablet, Rfl: 0   Multiple Vitamin (MULTI VITAMIN DAILY PO), Take by mouth., Disp: , Rfl:    venlafaxine XR (EFFEXOR XR) 37.5 MG 24 hr capsule, Take 1 capsule (37.5 mg total) by mouth daily with breakfast., Disp: 30 capsule, Rfl: 5   Objective:     Vitals:   03/03/22 1017  BP: 110/78  Pulse: 92  SpO2: 98%  Weight: 202 lb (91.6 kg)  Height: 5\' 9"  (1.753 m)      Body mass index is 29.83 kg/m.    Physical  Exam:    Gen: Appears well, nad, nontoxic and pleasant Psych: Alert and oriented, appropriate mood and affect Neuro: sensation intact, strength is 5/5 in upper and lower extremities, muscle tone wnl Skin: no susupicious lesions or rashes  Back - Normal skin, Spine with normal alignment and no deformity.   No tenderness to vertebral process palpation.   Paraspinous muscles are not tender and without spasm NTTP gluteal musculature TTP sacral base and bilateral SI joints Straight leg raise negative Trendelenberg negative Piriformis Test negative     OMT Physical Exam:  ASIS Compression Test: Positive left Sacral: Positive sphinx, TTP bilateral sacral base Lumbar: TTP paraspinal, L1-3 RLSR, L5 RR Pelvis: Left anterior innominate with out flare  Electronically signed by:  Marissa [name] D.[name] 10:48 AM 03/03/22

## 2022-03-03 ENCOUNTER — Ambulatory Visit: Payer: No Typology Code available for payment source | Admitting: Sports Medicine

## 2022-03-03 VITALS — BP 110/78 | HR 92 | Ht 69.0 in | Wt 202.0 lb

## 2022-03-03 DIAGNOSIS — G8929 Other chronic pain: Secondary | ICD-10-CM | POA: Diagnosis not present

## 2022-03-03 DIAGNOSIS — M533 Sacrococcygeal disorders, not elsewhere classified: Secondary | ICD-10-CM | POA: Diagnosis not present

## 2022-03-03 DIAGNOSIS — M545 Low back pain, unspecified: Secondary | ICD-10-CM

## 2022-03-03 DIAGNOSIS — M9905 Segmental and somatic dysfunction of pelvic region: Secondary | ICD-10-CM | POA: Diagnosis not present

## 2022-03-03 DIAGNOSIS — M9904 Segmental and somatic dysfunction of sacral region: Secondary | ICD-10-CM

## 2022-03-03 DIAGNOSIS — M9903 Segmental and somatic dysfunction of lumbar region: Secondary | ICD-10-CM

## 2022-03-03 MED ORDER — MELOXICAM 15 MG PO TABS: 15.0000 mg | ORAL_TABLET | Freq: Every day | ORAL | 0 refills | Status: DC

## 2022-03-03 MED ORDER — CYCLOBENZAPRINE HCL 5 MG PO TABS: 5.0000 mg | ORAL_TABLET | Freq: Every day | ORAL | 0 refills | Status: DC

## 2022-03-03 NOTE — Patient Instructions (Addendum)
[email or web address]  - Start meloxicam 15 mg daily x2 weeks.  If still having pain after 2 weeks, complete 3rd-week of meloxicam. May use remaining meloxicam as needed once daily for pain control.  Do not to use additional NSAIDs while taking meloxicam.  May use Tylenol [phone] mg 2 to 3 times a day for breakthrough pain. Flexeril 5-10 mg nightly as needed for muscle spasm  Low back HEP  Continue heating pad 3-4 week follow up

## 2022-03-19 ENCOUNTER — Other Ambulatory Visit: Payer: Self-pay | Admitting: Internal Medicine

## 2022-03-24 ENCOUNTER — Ambulatory Visit: Payer: No Typology Code available for payment source | Admitting: Sports Medicine

## 2022-03-24 NOTE — Progress Notes (Unsigned)
Benito Mccreedy D.[place] Phone: [phone]   Assessment and Plan:     There are no diagnoses linked to this encounter.  ***   Pertinent previous records reviewed include ***   Follow Up: ***     Subjective:   I, Malory Spurr, am serving as a Education administrator for Doctor Glennon Mac   Chief Complaint: low back pain    HPI:    03/03/2022 Patient is a 27 year old female complaining of low back pain. Patient states that a year and a half ago she was dx with DDD, till has pain after PT , epidural did not help a year ago, pain is not improving , has pain walking and standing up straight, does get sciatic symptoms , advil does not help anymore gabapentin doenst work , heating pad is the only thing that really helps   03/27/2022 Patient states    Relevant Historical Information: None pertinent  Additional pertinent review of systems negative.   Current Outpatient Medications:    cyclobenzaprine (FLEXERIL) 5 MG tablet, Take 1 tablet (5 mg total) by mouth at bedtime., Disp: 30 tablet, Rfl: 0   FLUoxetine (PROZAC) 20 MG capsule, Take 20 mg daily x 1 week, then take 20 mg QOD x 1 week then stop, Disp: 11 capsule, Rfl: 0   levonorgestrel (KYLEENA) 19.5 MG IUD, Take 1 device by intrauterine route., Disp: , Rfl:    meloxicam (MOBIC) 15 MG tablet, Take 1 tablet (15 mg total) by mouth daily., Disp: 30 tablet, Rfl: 0   Multiple Vitamin (MULTI VITAMIN DAILY PO), Take by mouth., Disp: , Rfl:    venlafaxine XR (EFFEXOR XR) 37.5 MG 24 hr capsule, Take 1 capsule (37.5 mg total) by mouth daily with breakfast., Disp: 30 capsule, Rfl: 5   Objective:     There were no vitals filed for this visit.    There is no height or weight on file to calculate BMI.    Physical Exam:    ***   Electronically signed by:  Benito Mccreedy D.[name] 11:54 AM 03/24/22

## 2022-03-27 ENCOUNTER — Ambulatory Visit (INDEPENDENT_AMBULATORY_CARE_PROVIDER_SITE_OTHER): Payer: No Typology Code available for payment source

## 2022-03-27 ENCOUNTER — Ambulatory Visit: Payer: No Typology Code available for payment source | Admitting: Sports Medicine

## 2022-03-27 ENCOUNTER — Ambulatory Visit: Payer: No Typology Code available for payment source

## 2022-03-27 VITALS — HR 73 | Ht 69.0 in | Wt 202.0 lb

## 2022-03-27 DIAGNOSIS — G8929 Other chronic pain: Secondary | ICD-10-CM | POA: Diagnosis not present

## 2022-03-27 DIAGNOSIS — M533 Sacrococcygeal disorders, not elsewhere classified: Secondary | ICD-10-CM | POA: Diagnosis not present

## 2022-03-27 DIAGNOSIS — M545 Low back pain, unspecified: Secondary | ICD-10-CM

## 2022-03-27 NOTE — Patient Instructions (Addendum)
Good to see you  Lumbar xray on the way out  SI joint  Lumbar Mri  Follow up 3 days after MRI to discuss your results

## 2022-04-04 ENCOUNTER — Other Ambulatory Visit: Payer: Self-pay | Admitting: Sports Medicine

## 2022-04-10 ENCOUNTER — Encounter: Payer: Self-pay | Admitting: Internal Medicine

## 2022-04-10 NOTE — Progress Notes (Unsigned)
    Subjective:    Patient ID: Marissa [name], female    DOB: [birth year], 27 y.o.   MRN: [id number]      HPI Koi is here for No chief complaint on file.    Nausea, dizziness -     Medications and allergies reviewed with patient and updated if appropriate.  Current Outpatient Medications on File Prior to Visit  Medication Sig Dispense Refill   cyclobenzaprine (FLEXERIL) 5 MG tablet Take 1 tablet (5 mg total) by mouth at bedtime. 30 tablet 0   FLUoxetine (PROZAC) 20 MG capsule Take 20 mg daily x 1 week, then take 20 mg QOD x 1 week then stop 11 capsule 0   levonorgestrel (KYLEENA) 19.5 MG IUD Take 1 device by intrauterine route.     meloxicam (MOBIC) 15 MG tablet Take 1 tablet (15 mg total) by mouth daily. 30 tablet 0   Multiple Vitamin (MULTI VITAMIN DAILY PO) Take by mouth.     venlafaxine XR (EFFEXOR XR) 37.5 MG 24 hr capsule Take 1 capsule (37.5 mg total) by mouth daily with breakfast. 30 capsule 5   No current facility-administered medications on file prior to visit.    Review of Systems     Objective:  There were no vitals filed for this visit. BP Readings from Last 3 Encounters:  03/03/22 110/78  02/14/22 108/74  09/19/21 112/78   Wt Readings from Last 3 Encounters:  03/27/22 202 lb (91.6 kg)  03/03/22 202 lb (91.6 kg)  02/14/22 209 lb (94.8 kg)   There is no height or weight on file to calculate BMI.    Physical Exam         Assessment & Plan:    See Problem List for Assessment and Plan of chronic medical problems.

## 2022-04-11 ENCOUNTER — Ambulatory Visit: Payer: No Typology Code available for payment source | Attending: Internal Medicine

## 2022-04-11 ENCOUNTER — Ambulatory Visit: Payer: No Typology Code available for payment source | Admitting: Internal Medicine

## 2022-04-11 VITALS — BP 108/74 | HR 94 | Temp 98.4°F | Ht 69.0 in | Wt 212.0 lb

## 2022-04-11 DIAGNOSIS — R002 Palpitations: Secondary | ICD-10-CM | POA: Diagnosis not present

## 2022-04-11 DIAGNOSIS — R42 Dizziness and giddiness: Secondary | ICD-10-CM

## 2022-04-11 LAB — CBC WITH DIFFERENTIAL/PLATELET
Basophils Absolute: 0 10*3/uL (ref 0.0–0.1)
Basophils Relative: 0.4 % (ref 0.0–3.0)
Eosinophils Absolute: 0.2 10*3/uL (ref 0.0–0.7)
Eosinophils Relative: 2.3 % (ref 0.0–5.0)
HCT: 41.2 % (ref 36.0–46.0)
Hemoglobin: 14.1 g/dL (ref 12.0–15.0)
Lymphocytes Relative: 32.8 % (ref 12.0–46.0)
Lymphs Abs: 2.7 10*3/uL (ref 0.7–4.0)
MCHC: 34.2 g/dL (ref 30.0–36.0)
MCV: 82.7 fl (ref 78.0–100.0)
Monocytes Absolute: 0.8 10*3/uL (ref 0.1–1.0)
Monocytes Relative: 9.4 % (ref 3.0–12.0)
Neutro Abs: 4.5 10*3/uL (ref 1.4–7.7)
Neutrophils Relative %: 55.1 % (ref 43.0–77.0)
Platelets: 323 10*3/uL (ref 150.0–400.0)
RBC: 4.98 Mil/uL (ref 3.87–5.11)
RDW: 12.6 % (ref 11.5–15.5)
WBC: 8.2 10*3/uL (ref 4.0–10.5)

## 2022-04-11 LAB — COMPREHENSIVE METABOLIC PANEL
ALT: 35 U/L (ref 0–35)
AST: 22 U/L (ref 0–37)
Albumin: 4.5 g/dL (ref 3.5–5.2)
Alkaline Phosphatase: 74 U/L (ref 39–117)
BUN: 11 mg/dL (ref 6–23)
CO2: 28 mEq/L (ref 19–32)
Calcium: 9.9 mg/dL (ref 8.4–10.5)
Chloride: 103 mEq/L (ref 96–112)
Creatinine, Ser: 0.81 mg/dL (ref 0.40–1.20)
GFR: 101.16 mL/min (ref >60.00)
Glucose, Bld: 70 mg/dL (ref 70–99)
Potassium: 4.3 mEq/L (ref 3.5–5.1)
Sodium: 137 mEq/L (ref 135–145)
Total Bilirubin: 0.3 mg/dL (ref 0.2–1.2)
Total Protein: 7.1 g/dL (ref 6.0–8.3)

## 2022-04-11 LAB — TSH: TSH: 2.7 u[IU]/mL (ref 0.35–5.50)

## 2022-04-11 NOTE — Patient Instructions (Signed)
      Blood work was ordered.   The lab is on the first floor.   An EKG was done today    Medications changes include :   none   A holter monitor was ordered - cardiology will contact you to schedule this.

## 2022-04-11 NOTE — Progress Notes (Unsigned)
Enrolled patient for a 3 day Zio Xt monitor to be mailed to patients home  DOD to read

## 2022-04-11 NOTE — Assessment & Plan Note (Signed)
New Started 2-3 weeks ago Atypical dizziness-not a true spinning and or feeling like she is being on a boat-a little bit more lightheadedness or feeling like her brain bounces in her head Associated with nausea, heart racing and easy feeling No specific pattern Can feel it when she shifts her vision without moving her head ?  Atypical vertigo Will check EKG given palpitations to make sure it is not a cardiac issue Check CBC, CMP, TSH No other concerning symptoms to suggest infection or other cause Consider vestibular PT

## 2022-04-11 NOTE — Assessment & Plan Note (Signed)
New Experiencing heart racing, possible palpitations This is associated with feeling of dizziness/lightheadedness EKG today: NSR at 88 bpm, normal EKG.  No prior EKG for comparison.  Compared to previous EKGs from January 2022 nonspecific ST T wave abnormality is no longer present. Given intermittent nature will request Holter for 3 days Check CBC, CMP, TSH Further evaluation depending on above

## 2022-04-14 ENCOUNTER — Ambulatory Visit
Admission: RE | Admit: 2022-04-14 | Discharge: 2022-04-14 | Disposition: A | Discharge status: Home / Self Care | Payer: No Typology Code available for payment source | Source: Ambulatory Visit | Attending: Sports Medicine | Admitting: Sports Medicine

## 2022-04-14 DIAGNOSIS — R002 Palpitations: Secondary | ICD-10-CM | POA: Diagnosis not present

## 2022-04-14 DIAGNOSIS — M545 Low back pain, unspecified: Secondary | ICD-10-CM

## 2022-04-14 DIAGNOSIS — R42 Dizziness and giddiness: Secondary | ICD-10-CM | POA: Diagnosis not present

## 2022-04-14 DIAGNOSIS — G8929 Other chronic pain: Secondary | ICD-10-CM

## 2022-04-17 ENCOUNTER — Ambulatory Visit: Payer: No Typology Code available for payment source | Admitting: Internal Medicine

## 2022-04-17 NOTE — Progress Notes (Unsigned)
    Marissa [name] D.Marissa [name] Phone: [phone]   Assessment and Plan:     There are no diagnoses linked to this encounter.  ***   Pertinent previous records reviewed include ***   Follow Up: ***     Subjective:   I, Marissa [name], am serving as a Education administrator for Marissa [name]   Chief Complaint: low back pain    HPI:    03/03/2022 Patient is a 27 year old female complaining of low back pain. Patient states that a year and a half ago she was dx with DDD, till has pain after PT , epidural did not help a year ago, pain is not improving , has pain walking and standing up straight, does get sciatic symptoms , advil does not help anymore gabapentin doenst work , heating pad is the only thing that really helps    03/27/2022 Patient states that she is good    04/18/2022 Patient states    Relevant Historical Information: None pertinent Additional pertinent review of systems negative.   Current Outpatient Medications:    cyclobenzaprine (FLEXERIL) 5 MG tablet, Take 1 tablet (5 mg total) by mouth at bedtime. (Patient not taking: Reported on 04/11/2022), Disp: 30 tablet, Rfl: 0   levonorgestrel (KYLEENA) 19.5 MG IUD, Take 1 device by intrauterine route., Disp: , Rfl:    meloxicam (MOBIC) 15 MG tablet, Take 1 tablet (15 mg total) by mouth daily. (Patient not taking: Reported on 04/11/2022), Disp: 30 tablet, Rfl: 0   Multiple Vitamin (MULTI VITAMIN DAILY PO), Take by mouth. (Patient not taking: Reported on 04/11/2022), Disp: , Rfl:    venlafaxine XR (EFFEXOR XR) 37.5 MG 24 hr capsule, Take 1 capsule (37.5 mg total) by mouth daily with breakfast. (Patient not taking: Reported on 04/11/2022), Disp: 30 capsule, Rfl: 5   Objective:     There were no vitals filed for this visit.    There is no height or weight on file to calculate BMI.    Physical Exam:    ***   Electronically signed by:  Marissa [name] D.[name] 7:36 AM 04/17/22

## 2022-04-18 ENCOUNTER — Ambulatory Visit: Payer: No Typology Code available for payment source | Admitting: Sports Medicine

## 2022-04-18 VITALS — BP 110/80 | HR 94 | Ht 69.0 in | Wt 212.0 lb

## 2022-04-18 DIAGNOSIS — G8929 Other chronic pain: Secondary | ICD-10-CM

## 2022-04-18 DIAGNOSIS — M5127 Other intervertebral disc displacement, lumbosacral region: Secondary | ICD-10-CM | POA: Diagnosis not present

## 2022-04-18 DIAGNOSIS — M533 Sacrococcygeal disorders, not elsewhere classified: Secondary | ICD-10-CM

## 2022-04-18 NOTE — Patient Instructions (Addendum)
Good to see you Epidural referral Follow up 2 weeks after to discuss results

## 2022-05-10 ENCOUNTER — Ambulatory Visit
Admission: RE | Admit: 2022-05-10 | Discharge: 2022-05-10 | Disposition: A | Discharge status: Home / Self Care | Payer: No Typology Code available for payment source | Source: Ambulatory Visit | Attending: Sports Medicine | Admitting: Sports Medicine

## 2022-05-10 DIAGNOSIS — M5127 Other intervertebral disc displacement, lumbosacral region: Secondary | ICD-10-CM

## 2022-05-10 DIAGNOSIS — G8929 Other chronic pain: Secondary | ICD-10-CM

## 2022-05-10 MED ORDER — METHYLPREDNISOLONE ACETATE 40 MG/ML INJ SUSP (RADIOLOG: 80.0000 mg | Freq: Once | INTRAMUSCULAR | Status: DC

## 2022-05-10 MED ORDER — IOPAMIDOL (ISOVUE-M 200) INJECTION 41%: 1.0000 mL | Freq: Once | INTRAMUSCULAR | Status: DC

## 2022-05-10 NOTE — Discharge Instructions (Signed)

## 2022-05-22 ENCOUNTER — Other Ambulatory Visit: Payer: Self-pay | Admitting: Internal Medicine

## 2022-05-31 ENCOUNTER — Encounter: Payer: Self-pay | Admitting: Sports Medicine

## 2022-05-31 ENCOUNTER — Encounter: Payer: Self-pay | Admitting: Internal Medicine

## 2022-06-01 MED ORDER — FLUOXETINE HCL 20 MG PO CAPS: 20.0000 mg | ORAL_CAPSULE | Freq: Every day | ORAL | 2 refills | Status: DC

## 2022-06-27 NOTE — Progress Notes (Unsigned)
      Subjective:    Patient ID: Marissa [name], female    DOB: [birth year], 27 y.o.   MRN: [id number]     HPI Rolando is here for follow up of her chronic medical problems.  Depression - prozac causes fatigued and defensive.  Has not taken it for the  past 5 days.  She still feels depressed.  She has no motivation to do anything.  Her patience is thin, gets anxious.    Sleep is ok - wakes up at least once a night.  Takes a little longer to fall asleep.   Trying to get back to exercise.   Medications and allergies reviewed with patient and updated if appropriate.  Current Outpatient Medications on File Prior to Visit  Medication Sig Dispense Refill   levonorgestrel (KYLEENA) 19.5 MG IUD Take 1 device by intrauterine route.     meloxicam (MOBIC) 15 MG tablet Take 1 tablet (15 mg total) by mouth daily. 30 tablet 0   Multiple Vitamin (MULTI VITAMIN DAILY PO) Take by mouth.     No current facility-administered medications on file prior to visit.     Review of Systems  Constitutional:  Positive for fever. Negative for appetite change.  Cardiovascular:  Negative for chest pain and palpitations.  Neurological:  Positive for headaches (since stopping the prozac). Negative for dizziness and light-headedness.  Psychiatric/Behavioral:  Positive for dysphoric mood and sleep disturbance (mild). Negative for self-injury and suicidal ideas. The patient is nervous/anxious.        Objective:   Vitals:   06/28/22 1316  BP: 102/80  Pulse: 87  Temp: 98.4 F (36.9 C)  SpO2: 98%   BP Readings from Last 3 Encounters:  06/28/22 102/80  04/18/22 110/80  04/11/22 108/74   Wt Readings from Last 3 Encounters:  06/28/22 205 lb (93 kg)  04/18/22 212 lb (96.2 kg)  04/11/22 212 lb (96.2 kg)   Body mass index is 30.27 kg/m.    Physical Exam Constitutional:      General: She is not in acute distress.    Appearance: Normal appearance. She is not ill-appearing.  HENT:     Head:  Normocephalic and atraumatic.  Skin:    General: Skin is warm and dry.  Neurological:     Mental Status: She is alert.  Psychiatric:        Behavior: Behavior normal.        Thought Content: Thought content normal.        Judgment: Judgment normal.     Comments: Depressed affect        Lab Results  Component Value Date   WBC 8.2 04/11/2022   HGB 14.1 04/11/2022   HCT 41.2 04/11/2022   PLT 323.0 04/11/2022   GLUCOSE 70 04/11/2022   CHOL 205 (H) 06/17/2020   TRIG 84.0 06/17/2020   HDL 44.00 06/17/2020   LDLCALC 144 (H) 06/17/2020   ALT 35 04/11/2022   AST 22 04/11/2022   NA 137 04/11/2022   K 4.3 04/11/2022   CL 103 04/11/2022   CREATININE 0.81 04/11/2022   BUN 11 04/11/2022   CO2 28 04/11/2022   TSH 2.70 04/11/2022   HGBA1C 5.2 09/19/2021     Assessment & Plan:    See Problem List for Assessment and Plan of chronic medical problems.

## 2022-06-28 ENCOUNTER — Encounter: Payer: Self-pay | Admitting: Internal Medicine

## 2022-06-28 ENCOUNTER — Ambulatory Visit: Payer: No Typology Code available for payment source | Admitting: Internal Medicine

## 2022-06-28 VITALS — BP 102/80 | HR 87 | Temp 98.4°F | Ht 69.0 in | Wt 205.0 lb

## 2022-06-28 DIAGNOSIS — F3289 Other specified depressive episodes: Secondary | ICD-10-CM | POA: Diagnosis not present

## 2022-06-28 DIAGNOSIS — F419 Anxiety disorder, unspecified: Secondary | ICD-10-CM

## 2022-06-28 MED ORDER — BUPROPION HCL ER (XL) 150 MG PO TB24: ORAL_TABLET | ORAL | 0 refills | Status: DC

## 2022-06-28 NOTE — Assessment & Plan Note (Signed)
Chronic Has some anxiety, but more depression than anxiety Did not tolerate fluoxetine or Effexor I think predominantly she is experiencing depression and hopefully we improve this her anxiety may also improve Will start Wellbutrin 150 mg daily-increase after 2 weeks to 300 mg daily Follow-up with me in 6 weeks, sooner if needed Continue regular exercise, getting outside which she enjoys Referral for therapy ordered

## 2022-06-28 NOTE — Assessment & Plan Note (Addendum)
Chronic Has some anxiety, but more depression than anxiety Did not tolerate fluoxetine or Effexor I think predominantly she is experiencing depression and hopefully we improve this her anxiety may also improve Will start Wellbutrin 150 mg daily-increase after 2 weeks to 300 mg daily Follow-up with me in 6 weeks, sooner if needed Continue regular exercise, getting outside which she enjoys Referral for therapy ordered 

## 2022-06-28 NOTE — Patient Instructions (Addendum)
        Medications changes include :   Wellbutrin xl 150 mg daily for 2 weeks then increase to 300 mg daily  A referral was ordered for a therapist at [facility].    Return in about 6 weeks (around 08/09/2022) for follow up.     [facility] at [facility] [phone]   [facility] [address] [phone]  [facility]  [address] [phone]  [facility] [address] [phone] Office  [facility]            [address] [phone]

## 2022-07-22 ENCOUNTER — Other Ambulatory Visit: Payer: Self-pay | Admitting: Internal Medicine

## 2022-08-01 ENCOUNTER — Encounter: Payer: Self-pay | Admitting: Internal Medicine

## 2022-08-17 ENCOUNTER — Encounter: Payer: Self-pay | Admitting: Internal Medicine

## 2022-08-17 NOTE — Progress Notes (Signed)
Virtual Visit via Video Note  I connected with Marissa [name] on 08/17/22 at  2:00 PM EDT by a video enabled telemedicine application and verified that I am speaking with the correct person using two identifiers.   I discussed the limitations of evaluation and management by telemedicine and the availability of in person appointments. The patient expressed understanding and agreed to proceed.  Present for the visit:  Myself, Dr Cheryll Cockayne, Hansel Feinstein.  The patient is currently at home and I am in the office.    No referring provider.    History of Present Illness: She is here for follow up of her chronic medical conditions.    She just moved to [place] and is working with a [employer] architect.  The move was stressful.  We started her on bupropion for her anxiety and depression just less than 2 months ago.  She still feels anxious and would like to add another medication.  With her anxiety she finds herself getting more impatient.   Weight gain- doing 75 hard - doing a macro friendly.  Working up 1-2 times a day.  Her back sometimes makes it difficult for her to work out.  She is eating well and feels like she is doing good with her calorie intake, but is not losing weight.  She actually did gain weight and she is not sure why.  She is concerned about her weight and wanted to discuss medications for weight loss.   Social History   Socioeconomic History   Marital status: Single    Spouse name: Not on file   Number of children: Not on file   Years of education: Not on file   Highest education level: Not on file  Occupational History   Not on file  Tobacco Use   Smoking status: Never   Smokeless tobacco: Never  Vaping Use   Vaping Use: Former  Substance and Sexual Activity   Alcohol use: No    Comment: occ   Drug use: No   Sexual activity: Not on file  Other Topics Concern   Not on file  Social History Narrative   Caffeine 1-2 cups daily. Energy drink (3 x week).    Work [employer].  (OCT).    Social Determinants of Health   Financial Resource Strain: Not on file  Food Insecurity: Not on file  Transportation Needs: Not on file  Physical Activity: Not on file  Stress: Not on file  Social Connections: Not on file     Observations/Objective: Appears well in NAD Breathing normally Skin.  Warm and dry Mood overall appears normal  Assessment and Plan:  See Problem List for Assessment and Plan of chronic medical problems.   Follow Up Instructions:    I discussed the assessment and treatment plan with the patient. The patient was provided an opportunity to ask questions and all were answered. The patient agreed with the plan and demonstrated an understanding of the instructions.   The patient was advised to call back or seek an in-person evaluation if the symptoms worsen or if the condition fails to improve as anticipated.    Pincus Sanes, MD

## 2022-08-18 ENCOUNTER — Telehealth: Payer: No Typology Code available for payment source | Admitting: Internal Medicine

## 2022-08-18 DIAGNOSIS — F3289 Other specified depressive episodes: Secondary | ICD-10-CM | POA: Diagnosis not present

## 2022-08-18 DIAGNOSIS — R635 Abnormal weight gain: Secondary | ICD-10-CM

## 2022-08-18 DIAGNOSIS — F419 Anxiety disorder, unspecified: Secondary | ICD-10-CM

## 2022-08-18 MED ORDER — BUSPIRONE HCL 5 MG PO TABS: 5.0000 mg | ORAL_TABLET | Freq: Three times a day (TID) | ORAL | 1 refills | Status: DC | PRN

## 2022-08-18 MED ORDER — BUPROPION HCL ER (XL) 300 MG PO TB24: 300.0000 mg | ORAL_TABLET | Freq: Every day | ORAL | 1 refills | Status: DC

## 2022-08-18 NOTE — Assessment & Plan Note (Signed)
Chronic Not ideally controlled Continue bupropion 300 mg daily Will try adding buspirone 5 mg 3 times daily as needed-discussed possible side effects and discussed that she can take this as needed or consistently depending on what works best for her Would like to avoid an additional SSRI to avoid possibility of something causing weight gain

## 2022-08-18 NOTE — Assessment & Plan Note (Signed)
Chronic Seems to be controlled with bupropion XL 300 mg daily-continue

## 2022-08-18 NOTE — Assessment & Plan Note (Signed)
Chronic Frustrated by weight gain despite exercising and eating very healthy and keeping her calories down Discussed briefly weight loss medications Continue regular exercise-she is limited a little bit by her back pain Sounds like she is eating very healthy-encouraged her to do calorie counting for couple of days just to see where she is at-she thinks she is not consuming calories which could be the case however could also be consuming too many calories and knowing this for sure would help her revise her intake If this is not helping we could consider medication to help, but discussed she cannot get pregnant while on the medication-she is feeling well trying sometime next year

## 2022-09-07 ENCOUNTER — Encounter: Payer: Self-pay | Admitting: Internal Medicine

## 2022-09-10 ENCOUNTER — Other Ambulatory Visit: Payer: Self-pay | Admitting: Internal Medicine

## 2022-10-18 ENCOUNTER — Ambulatory Visit: Payer: No Typology Code available for payment source | Admitting: Licensed Clinical Social Worker

## 2022-10-26 ENCOUNTER — Encounter: Payer: Self-pay | Admitting: Sports Medicine

## 2022-11-01 ENCOUNTER — Ambulatory Visit: Payer: No Typology Code available for payment source | Admitting: Licensed Clinical Social Worker

## 2022-11-01 DIAGNOSIS — F32A Depression, unspecified: Secondary | ICD-10-CM | POA: Diagnosis not present

## 2022-11-01 DIAGNOSIS — F4010 Social phobia, unspecified: Secondary | ICD-10-CM | POA: Insufficient documentation

## 2022-11-01 DIAGNOSIS — F411 Generalized anxiety disorder: Secondary | ICD-10-CM

## 2022-11-01 NOTE — Addendum Note (Signed)
Addended by: Laurence Slate on: 11/01/2022 09:56 AM   Modules accepted: Level of Service
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
## 2022-11-22 ENCOUNTER — Ambulatory Visit: Payer: BC Managed Care – PPO | Admitting: Licensed Clinical Social Worker

## 2022-11-22 DIAGNOSIS — F4010 Social phobia, unspecified: Secondary | ICD-10-CM | POA: Diagnosis not present
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
## 2022-12-06 ENCOUNTER — Ambulatory Visit (INDEPENDENT_AMBULATORY_CARE_PROVIDER_SITE_OTHER): Payer: BC Managed Care – PPO | Admitting: Licensed Clinical Social Worker

## 2022-12-06 DIAGNOSIS — F411 Generalized anxiety disorder: Secondary | ICD-10-CM | POA: Diagnosis not present

## 2022-12-06 NOTE — Progress Notes (Signed)
[facility] Counselor/Therapist Progress Note  Patient ID: Marissa [name], MRN: [id number]    Date: 12/06/22  Time Spent: 0901  am - 0948 am : 47 Minutes  Treatment Type: Individual Therapy.  Reported Symptoms: Symptoms of anxiety and stress  Mental Status Exam: Appearance:  Casual     Behavior: Appropriate  Motor: Normal  Speech/Language:  Clear and Coherent  Affect: Appropriate  Mood: sad  Thought process: normal  Thought content:   WNL  Sensory/Perceptual disturbances:   WNL  Orientation: oriented to person, place, time/date, situation, day of week, month of year, and year  Attention: Good  Concentration: Good  Memory: WNL  Fund of knowledge:  Good  Insight:   Good  Judgment:  Good  Impulse Control: Good   Risk Assessment: Danger to Self:  No Self-injurious Behavior: No Danger to Others: No Duty to Warn:no Physical Aggression / Violence:No  Access to Firearms a concern: No  Gang Involvement:No   Subjective:   Marissa [name] participated from home, via video, and consented to treatment. Therapist participated from office. We met online due to patient request.   Marissa [name] presented for her session identifying her stress related to her upcoming wedding. Patient shared a variety of stressors including both financial and stress over where she plans to be in five years with her career. Clinician and patient processed the idea of speaking to a college advisor to see what some of her options might be. Clinician also provided patient with information regarding [facility] CMA program. Clinician encouraged patient to think positively and not allow herself to dwell on things that she can't control for her wedding. Clinician encouraged patient to focus on the excitement of her special day and utilize breathing techniques to assist when feeling overwhelmed.   Interventions: Cognitive Behavioral Therapy and Supportive Listening, Breathing Techniques/Positive  thinking  Diagnosis: Generalized Anxiety Disorder   Plan: Marissa [name]  is to use CBT, mindfulness and coping skills to help manage decrease symptoms associated with their diagnosis.   Long-term goal:   Marissa [name] will reduce overall level, frequency, and intensity of the feelings of stress and  anxiety  evidenced by decreased irritability, negative self talk, and helpless feelings from 6 to 7 days/week to 0 to 2 days/week per client report for at least 3 consecutive months. Treatment plan to be reviewed by 11/01/2023.  Short-term goal:  Marissa [name] will verbally express understanding of the relationship between feelings of stress and anxiety and their impact on thinking patterns and behaviors. Verbalize an understanding of the role that distorted thinking plays in creating fears, excessive worry, and ruminations.  Phyllis Ginger MSW, LCSW DATE:12/06/2022

## 2022-12-07 ENCOUNTER — Encounter: Payer: Self-pay | Admitting: Internal Medicine

## 2022-12-19 ENCOUNTER — Ambulatory Visit: Payer: BC Managed Care – PPO | Admitting: Internal Medicine

## 2022-12-19 ENCOUNTER — Encounter: Payer: Self-pay | Admitting: Internal Medicine

## 2022-12-19 VITALS — BP 112/72 | HR 98 | Temp 98.1°F | Ht 69.0 in | Wt 190.0 lb

## 2022-12-19 DIAGNOSIS — R142 Eructation: Secondary | ICD-10-CM | POA: Insufficient documentation

## 2022-12-19 DIAGNOSIS — R1010 Upper abdominal pain, unspecified: Secondary | ICD-10-CM | POA: Insufficient documentation

## 2022-12-19 DIAGNOSIS — K769 Liver disease, unspecified: Secondary | ICD-10-CM

## 2022-12-19 DIAGNOSIS — F419 Anxiety disorder, unspecified: Secondary | ICD-10-CM

## 2022-12-19 DIAGNOSIS — K219 Gastro-esophageal reflux disease without esophagitis: Secondary | ICD-10-CM | POA: Diagnosis not present

## 2022-12-19 DIAGNOSIS — R1011 Right upper quadrant pain: Secondary | ICD-10-CM | POA: Insufficient documentation

## 2022-12-19 MED ORDER — FAMOTIDINE 40 MG PO TABS: 40.0000 mg | ORAL_TABLET | Freq: Every day | ORAL | 2 refills | Status: DC

## 2022-12-19 NOTE — Assessment & Plan Note (Signed)
Chronic Not ideally controlled At this point she does not want to make any medication changes-will begin married in a couple of days and hopefully getting through that will make a difference Continue bupropion XL 300 mg daily Continue buspirone-taking 5 mg 1-2 times a day-can increase this to 7.5 mg or 10 mg to see if that helps more

## 2022-12-19 NOTE — Progress Notes (Signed)
Subjective:    Patient ID: Marissa [name], female    DOB: 02-26-1996, 27 y.o.   MRN: [id number]      HPI Marissa [name] is here for  Chief Complaint  Patient presents with   burping    Started in April/May (has continued and getting worse)    Getting married in a couple of days.  Denies depression, but does feel anxiety. Takes buspar 1-2 times a day.  Medication does not really seem to help.  She thinks she wants to hold off on any changes and just try to get through the wedding.   Burping - started in April- May.  ? Related to stress.  Has never been a burper.  It is getting worse and more often.  She occasionally drinks carbonated beverage - alcoholic but not regularly.  When she lays down at night she has a lot of burping.   Occ reflux.  Occ abdominal pain that is across her upper abdomen.  Sometimes the pain is worse with food.     Medications and allergies reviewed with patient and updated if appropriate.  Current Outpatient Medications on File Prior to Visit  Medication Sig Dispense Refill   ASHWAGANDHA GUMMIES PO Take by mouth.     buPROPion (WELLBUTRIN XL) 300 MG 24 hr tablet Take 1 tablet (300 mg total) by mouth daily. 90 tablet 1   busPIRone (BUSPAR) 5 MG tablet TAKE 1 TABLET (5 MG TOTAL) BY MOUTH 3 (THREE) TIMES DAILY AS NEEDED (ANXIETY). 270 tablet 1   levonorgestrel (KYLEENA) 19.5 MG IUD Take 1 device by intrauterine route.     Multiple Vitamin (MULTI VITAMIN DAILY PO) Take by mouth. (Patient not taking: Reported on 11/01/2022)     No current facility-administered medications on file prior to visit.    Review of Systems  Constitutional:  Negative for fever.  Respiratory:  Negative for cough, shortness of breath and wheezing.   Cardiovascular:  Positive for palpitations. Negative for chest pain.  Gastrointestinal:  Positive for abdominal pain (occ) and nausea (occ). Negative for blood in stool (no black stool), constipation, diarrhea and vomiting.       Occ reflux,   frequent burping       Objective:   Vitals:   12/19/22 0842  BP: 112/72  Pulse: 98  Temp: 98.1 F (36.7 C)  SpO2: 98%   BP Readings from Last 3 Encounters:  12/19/22 112/72  06/28/22 102/80  04/18/22 110/80   Wt Readings from Last 3 Encounters:  12/19/22 190 lb (86.2 kg)  06/28/22 205 lb (93 kg)  04/18/22 212 lb (96.2 kg)   Body mass index is 28.06 kg/m.    Physical Exam Constitutional:      General: She is not in acute distress.    Appearance: Normal appearance. She is not ill-appearing.  HENT:     Head: Normocephalic and atraumatic.  Abdominal:     General: There is no distension.     Palpations: Abdomen is soft. There is no mass.     Tenderness: There is abdominal tenderness (Across upper abdomen). There is no guarding or rebound.     Hernia: No hernia is present.  Skin:    General: Skin is warm and dry.     Findings: No rash.  Neurological:     Mental Status: She is alert.  Psychiatric:     Comments: Slightly anxious mood            Assessment & Plan:  See Problem List for Assessment and Plan of chronic medical problems.

## 2022-12-19 NOTE — Assessment & Plan Note (Signed)
New Has occasional reflux Does have increased burping which is likely a symptom of her reflux Also having some discomfort in the upper abdomen-?  Gastritis, GERD versus gallbladder disease Right upper quadrant ultrasound Start Pepcid 40 mg daily-advised to take for a few weeks and then taper off

## 2022-12-19 NOTE — Assessment & Plan Note (Signed)
Acute Having pain across the upper abdomen Could be related to GERD, possible gastritis, gallbladder disease Start Pepcid 40 mg daily for a few weeks and then taper off Right upper quadrant abdominal ultrasound

## 2022-12-19 NOTE — Assessment & Plan Note (Signed)
Acute Has had increased burping for several months Likely a symptom of GERD Will start Pepcid 40 mg daily for a few weeks until symptoms are well-controlled and then she can taper off and take as needed She will let me know if her burping does not improve

## 2022-12-19 NOTE — Patient Instructions (Addendum)
  .      Medications changes include :   pepcid 40 mg daily    A ultrasound of your abdomen was ordered and someone will call you to schedule an appointment.     Return if symptoms worsen or fail to improve.

## 2022-12-28 IMAGING — CR DG CHEST 2V
2 series · 2 of 2 positions shown · non-contrast
Comparison: None.

CLINICAL DATA: Chest pain and shortness of breath for 1 day

EXAM:
CHEST - 2 VIEW

[chest pa]
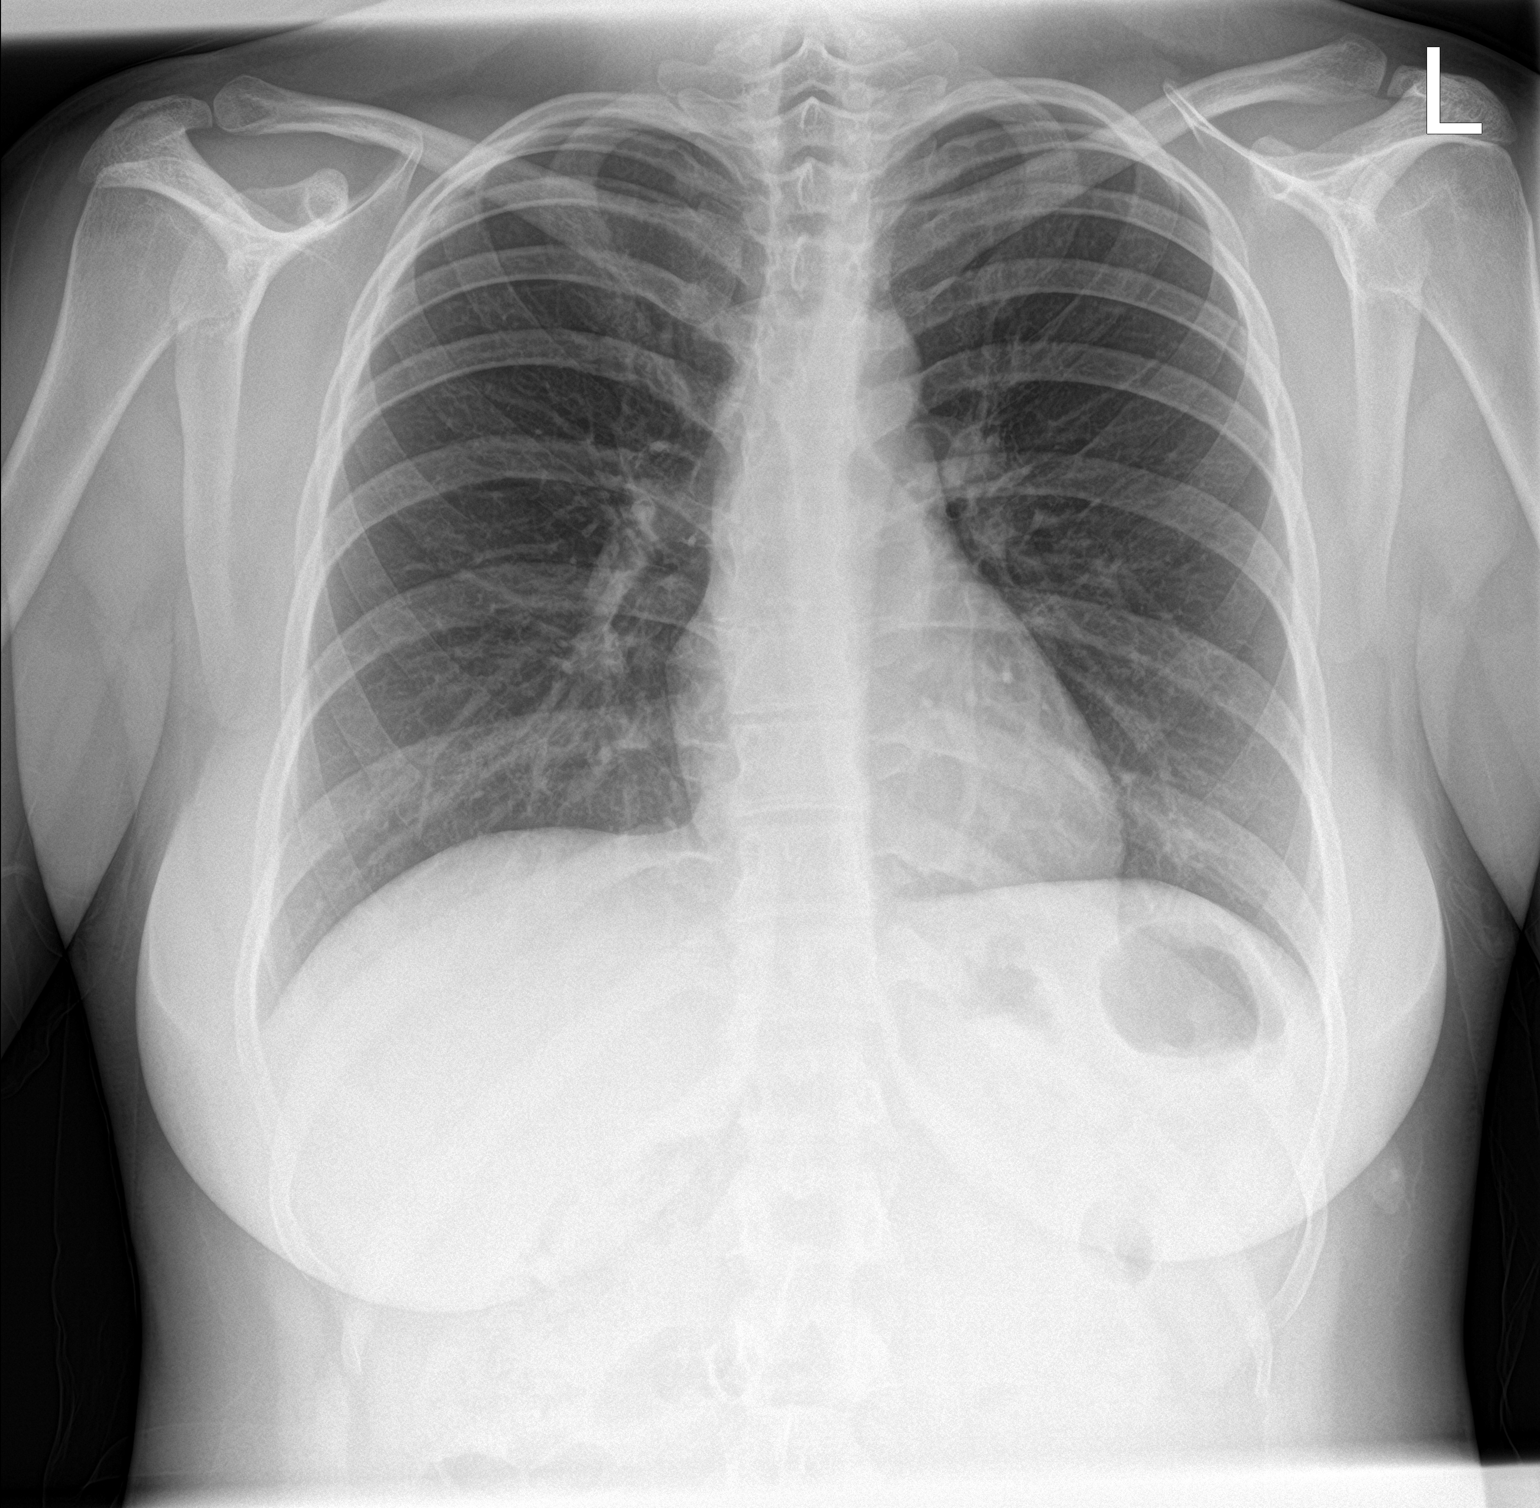

[chest lat]
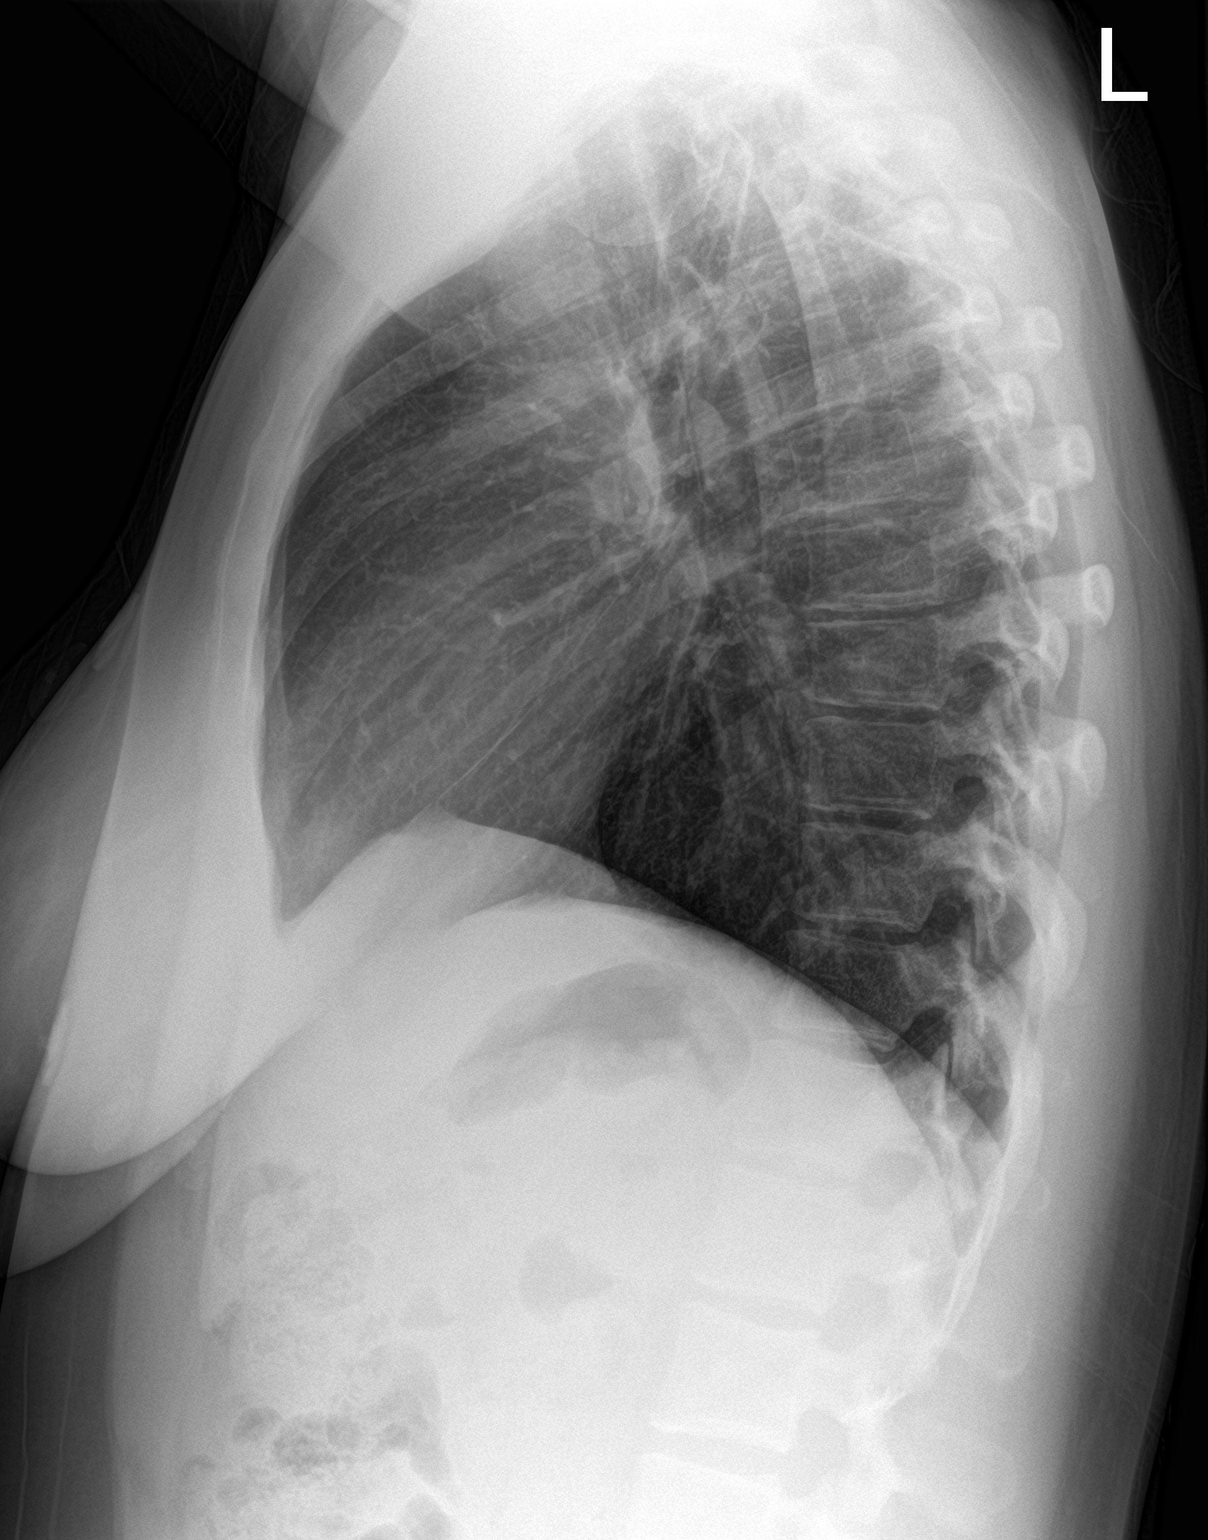

[2 of 2 positions shown; findings below may reference images not displayed]

FINDINGS: The heart size and mediastinal contours are within normal limits.
Both lungs are clear. The visualized skeletal structures are
unremarkable.
IMPRESSION: No active cardiopulmonary disease.

## 2023-01-03 ENCOUNTER — Ambulatory Visit: Payer: BC Managed Care – PPO | Admitting: Licensed Clinical Social Worker

## 2023-01-03 NOTE — Progress Notes (Unsigned)
[facility] Counselor/Therapist Progress Note  Patient ID: MATTIE NORDELL, MRN: [id number]    Date: 01/03/23  Time Spent: ***  {LBBHAMPM:26719} - *** {LBBHAMPM:26719} : *** Minutes  Treatment Type: Individual Therapy.  Reported Symptoms: ***  Mental Status Exam: Appearance:  {PSY:22683}     Behavior: {PSY:21022743}  Motor: {PSY:22302}  Speech/Language:  {PSY:22685}  Affect: {PSY:22687}  Mood: {PSY:31886}  Thought process: {PSY:31888}  Thought content:   {[phone]}  Sensory/Perceptual disturbances:   {[phone]}  Orientation: {PSY:30297}  Attention: {PSY:22877}  Concentration: {PSY:239-306-1623}  Memory: {[phone]}  Fund of knowledge:  {PSY:239-306-1623}  Insight:   {PSY:239-306-1623}  Judgment:  {PSY:239-306-1623}  Impulse Control: {PSY:239-306-1623}   Risk Assessment: Danger to Self:  {PSY:22692} Self-injurious Behavior: {PSY:22692} Danger to Others: {PSY:22692} Duty to Warn:{PSY:311194} Physical Aggression / Violence:{PSY:21197} Access to Firearms a concern: {PSY:21197} Gang Involvement:{PSY:21197}  Subjective:   [name] participated from {Patient Location:26691::"home"}, via {LBBHVIDEOORPHONE:26720}, and consented to treatment. Therapist participated from {LBBHPROVIDERLOCATION:26721}. We met online due to COVID pandemic.   ***   Interventions: {PSY:[phone]}  Diagnosis: No diagnosis found.   Plan: ***Patient is to use CBT, mindfulness and coping skills to help manage decrease symptoms associated with their diagnosis.   Long-term goal:   ***Reduce overall level, frequency, and intensity of the feelings of depression, anxiety and panic evidenced by       decreased irritability, negative self talk, and helpless feelings from 6 to 7 days/week to 0 to 1 days/week per client report for at least 3 consecutive months.  Short-term goal:  ***Verbally express understanding of the relationship between feelings of depression, anxiety and  their impact on thinking patterns and behaviors. Verbalize an understanding of the role that distorted thinking plays in creating fears, excessive worry, and ruminations.  Phyllis Ginger MSW, LCSW/DATE

## 2023-01-10 ENCOUNTER — Ambulatory Visit: Payer: BC Managed Care – PPO | Admitting: Licensed Clinical Social Worker

## 2023-01-10 DIAGNOSIS — F411 Generalized anxiety disorder: Secondary | ICD-10-CM | POA: Diagnosis not present

## 2023-01-10 NOTE — Progress Notes (Signed)
[facility] Counselor/Therapist Progress Note  Patient ID: Marissa [name], MRN: [id number]    Date: 01/10/23  Time Spent: 0800  am - 0855 am : 55 Minutes  Treatment Type: Individual Therapy.  Reported Symptoms: Anxiety related to future career and decisions  Mental Status Exam: Appearance:  Casual     Behavior: Appropriate  Motor: Normal  Speech/Language:  Clear and Coherent  Affect: Appropriate  Mood: normal  Thought process: normal  Thought content:   WNL  Sensory/Perceptual disturbances:   WNL  Orientation: oriented to person, place, time/date, situation, day of week, month of year, and year  Attention: Good  Concentration: Good  Memory: WNL  Fund of knowledge:  Good  Insight:   Good  Judgment:  Good  Impulse Control: Good   Risk Assessment: Danger to Self:  No Self-injurious Behavior: No Danger to Others: No Duty to Warn:no Physical Aggression / Violence:No  Access to Firearms a concern: NO Gang Involvement:No   Subjective:   Marissa [name] participated from home, via video, and consented to treatment. Therapist participated from office. We met online due to patient request.   Marissa [name] presented for her session and was fully engaged and interactive with Clinician. Patient shared that she now feels very stressed about her career and her future. 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 She shared the timeline on the classes she has already [place] nd that she feels a time crunch to get into a program. 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 Clinician urged patient to call the 2 last schools that she hasn't heard from to see where they are in the admission process. Clinician also encouraged  patient to give serious thought to her goals and what makes her happy.   Interventions: Cognitive Behavioral Therapy  Diagnosis: Generalized Anxiety Disorder   Plan: Carel is to use CBT, mindfulness and coping skills to help manage decrease symptoms associated with their diagnosis.   Long-term goal:   Frayda will reduce overall level, frequency, and intensity of the feelings of anxiety and panic evidenced by decreased irritability, negative self talk, and helpless feelings from 6 to 7 days/week to 0 to2 days/week per client report for at least 3 consecutive months. Treatment plan to be reviewed by 11/01/2023.  Short-term goal:  Franki will verbally express understanding of the relationship between feelings of anxiety and their impact on thinking patterns and behaviors. Verbalize an understanding of the role that distorted thinking plays in creating fears, excessive worry, and ruminations.  Phyllis Ginger MSW, LCSW DATE: 01/10/2023

## 2023-01-17 ENCOUNTER — Ambulatory Visit
Admission: RE | Admit: 2023-01-17 | Discharge: 2023-01-17 | Disposition: A | Discharge status: Home / Self Care | Payer: No Typology Code available for payment source | Source: Ambulatory Visit | Attending: Internal Medicine | Admitting: Internal Medicine

## 2023-01-17 DIAGNOSIS — R1011 Right upper quadrant pain: Secondary | ICD-10-CM | POA: Diagnosis not present

## 2023-01-17 DIAGNOSIS — R1010 Upper abdominal pain, unspecified: Secondary | ICD-10-CM

## 2023-01-17 NOTE — Addendum Note (Signed)
Addended by: Pincus Sanes on: 01/17/2023 01:32 PM   Modules accepted: Orders

## 2023-01-26 ENCOUNTER — Encounter: Payer: Self-pay | Admitting: Internal Medicine

## 2023-01-26 DIAGNOSIS — R1010 Upper abdominal pain, unspecified: Secondary | ICD-10-CM

## 2023-01-26 DIAGNOSIS — R142 Eructation: Secondary | ICD-10-CM

## 2023-02-06 NOTE — Addendum Note (Signed)
Addended by: Pincus Sanes on: 02/06/2023 05:29 AM   Modules accepted: Orders

## 2023-02-07 DIAGNOSIS — Z01419 Encounter for gynecological examination (general) (routine) without abnormal findings: Secondary | ICD-10-CM | POA: Diagnosis not present

## 2023-02-07 DIAGNOSIS — Z1331 Encounter for screening for depression: Secondary | ICD-10-CM | POA: Diagnosis not present

## 2023-02-07 DIAGNOSIS — Z30432 Encounter for removal of intrauterine contraceptive device: Secondary | ICD-10-CM | POA: Diagnosis not present

## 2023-02-07 DIAGNOSIS — Z118 Encounter for screening for other infectious and parasitic diseases: Secondary | ICD-10-CM | POA: Diagnosis not present

## 2023-02-08 NOTE — Addendum Note (Signed)
Addended by: Pincus Sanes on: 02/08/2023 09:06 PM   Modules accepted: Orders

## 2023-02-14 ENCOUNTER — Ambulatory Visit: Payer: BC Managed Care – PPO | Admitting: Licensed Clinical Social Worker

## 2023-02-20 ENCOUNTER — Other Ambulatory Visit: Payer: Self-pay | Admitting: Internal Medicine

## 2023-02-22 ENCOUNTER — Ambulatory Visit: Payer: No Typology Code available for payment source | Admitting: Licensed Clinical Social Worker

## 2023-02-22 NOTE — Progress Notes (Unsigned)
 Behavioral Health Counselor/Therapist Progress Note  Patient ID: Marissa [name], MRN: [id number]    Date: 02/22/23  Time Spent: ***  {LBBHAMPM:26719} - *** {LBBHAMPM:26719} : *** Minutes  Treatment Type: Individual Therapy.  Reported Symptoms: ***  Mental Status Exam: Appearance:  {PSY:22683}     Behavior: {PSY:21022743}  Motor: {PSY:22302}  Speech/Language:  {PSY:22685}  Affect: {PSY:22687}  Mood: {PSY:31886}  Thought process: {PSY:31888}  Thought content:   {[phone]}  Sensory/Perceptual disturbances:   {PSY:913-651-1000}  Orientation: {PSY:30297}  Attention: {PSY:22877}  Concentration: {[phone]}  Memory: {PSY:614-527-0613}  Fund of knowledge:  {[phone]}  Insight:   {[phone]}  Judgment:  {[phone]}  Impulse Control: {[phone]}   Risk Assessment: Danger to Self:  {PSY:22692} Self-injurious Behavior: {PSY:22692} Danger to Others: {PSY:22692} Duty to Warn:{PSY:311194} Physical Aggression / Violence:{PSY:21197} Access to Firearms a concern: {PSY:21197} Gang Involvement:{PSY:21197}  Subjective:   Marissa [name] participated from {Patient Location:[address]::"home"}, via {LBBHVIDEOORPHONE:26720}, and consented to treatment. Therapist participated from {LBBHPROVIDERLOCATION:26721}. We met online due to COVID pandemic.   ***   Interventions: {PSY:248-241-7450}  Diagnosis: No diagnosis found.   Plan: ***Patient is to use CBT, mindfulness and coping skills to help manage decrease symptoms associated with their diagnosis.   Long-term goal:   ***Reduce overall level, frequency, and intensity of the feelings of depression, anxiety and panic evidenced by       decreased irritability, negative self talk, and helpless feelings from 6 to 7 days/week to 0 to 1 days/week per client report for at least 3 consecutive months.  Short-term goal:  ***Verbally express understanding of the relationship between feelings of depression, anxiety and  their impact on thinking patterns and behaviors. Verbalize an understanding of the role that distorted thinking plays in creating fears, excessive worry, and ruminations.  Phyllis Ginger MSW, LCSW/DATE

## 2023-03-15 ENCOUNTER — Encounter: Payer: Self-pay | Admitting: Gastroenterology

## 2023-03-23 ENCOUNTER — Other Ambulatory Visit: Payer: Self-pay | Admitting: Internal Medicine

## 2023-03-27 MED ORDER — FLUOXETINE HCL 20 MG PO CAPS: 20.0000 mg | ORAL_CAPSULE | Freq: Every day | ORAL | 1 refills | Status: DC

## 2023-03-27 NOTE — Addendum Note (Signed)
Addended by: Pincus Sanes on: 03/27/2023 09:10 PM   Modules accepted: Orders

## 2023-04-11 ENCOUNTER — Ambulatory Visit (INDEPENDENT_AMBULATORY_CARE_PROVIDER_SITE_OTHER): Payer: BC Managed Care – PPO | Admitting: Gastroenterology

## 2023-04-11 ENCOUNTER — Other Ambulatory Visit: Payer: BC Managed Care – PPO

## 2023-04-11 ENCOUNTER — Encounter: Payer: Self-pay | Admitting: Gastroenterology

## 2023-04-11 VITALS — BP 122/80 | HR 86 | Ht 69.0 in | Wt 205.2 lb

## 2023-04-11 DIAGNOSIS — R1010 Upper abdominal pain, unspecified: Secondary | ICD-10-CM | POA: Diagnosis not present

## 2023-04-11 DIAGNOSIS — R142 Eructation: Secondary | ICD-10-CM | POA: Diagnosis not present

## 2023-04-11 DIAGNOSIS — R1011 Right upper quadrant pain: Secondary | ICD-10-CM

## 2023-04-11 DIAGNOSIS — K219 Gastro-esophageal reflux disease without esophagitis: Secondary | ICD-10-CM | POA: Diagnosis not present

## 2023-04-11 DIAGNOSIS — R110 Nausea: Secondary | ICD-10-CM | POA: Insufficient documentation

## 2023-04-11 MED ORDER — PANTOPRAZOLE SODIUM 40 MG PO TBEC: 40.0000 mg | DELAYED_RELEASE_TABLET | Freq: Every day | ORAL | 5 refills | Status: DC

## 2023-04-11 NOTE — Progress Notes (Signed)
Agree with the assessment and plan as outlined by Doug Sou, PA-C.  Would also suggest testing for H.pylori infection.  Shiza Thelen E. Tomasa Rand, MD  [facility]

## 2023-04-11 NOTE — Progress Notes (Signed)
04/11/2023 [name] [id number] 25-Apr-1995   HISTORY OF PRESENT ILLNESS: This is a 28 year old female who is new to our office.  She has been referred here by her PCP, Dr. Lawerance Bach, for evaluation of upper abdominal pain and belching.  She tells me that this all started around April or May of last year.  She started with extreme belching.  She says that it has not gotten better, in fact has gotten worse.  Occurs all the time, but worse after eating.  She also has nausea that it is pretty constant as well, but definitely worse after eating.  Reports right upper quadrant abdominal pain.  Reports a lot of fatigue.  Says that she actually has not had any nausea for the past few weeks.  Occasionally gets some regurgitation if she eats specific foods.  She did try Pepcid/famotidine 40 mg daily for quite some time, but help her symptoms.  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 so has been under a lot of stress lately.  Past Medical History:  Diagnosis Date   Allergy    Fatty liver disease, nonalcoholic    Past Surgical History:  Procedure Laterality Date   NO PAST SURGERIES      reports that she has never smoked. She has never used smokeless tobacco. She reports current alcohol use. She reports that she does not use drugs. family history includes Cancer in her maternal grandfather; Heart disease in her father; Renal cancer in her maternal grandfather. Allergies  Allergen Reactions   Gabapentin Other (See Comments)    lightheaded      Outpatient Encounter Medications as of 04/11/2023  Medication Sig   FLUoxetine (PROZAC) 20 MG capsule Take 1 capsule (20 mg total) by mouth daily.   MAGNESIUM GLYCINATE PO Take 1 capsule by mouth daily.   Prenatal Vit-Fe Fumarate-FA (PRENATAL VITAMIN PO) Take 1 capsule by mouth daily.   ASHWAGANDHA GUMMIES PO Take by mouth. (Patient not taking: Reported on 04/11/2023)   buPROPion (WELLBUTRIN XL) 300 MG 24 hr tablet TAKE 1 TABLET BY  MOUTH EVERY DAY (Patient not taking: Reported on 04/11/2023)   famotidine (PEPCID) 40 MG tablet TAKE 1 TABLET BY MOUTH EVERY DAY (Patient not taking: Reported on 04/11/2023)   levonorgestrel (KYLEENA) 19.5 MG IUD Take 1 device by intrauterine route. (Patient not taking: Reported on 04/11/2023)   No facility-administered encounter medications on file as of 04/11/2023.     REVIEW OF SYSTEMS  : All other systems reviewed and negative except where noted in the History of Present Illness.   PHYSICAL EXAM: BP 122/80 (BP Location: Right Arm, Patient Position: Sitting, Cuff Size: Normal)   Pulse 86   Ht 5\' 9"  (1.753 m)   Wt 205 lb 4 oz (93.1 kg)   SpO2 98%   BMI 30.31 kg/m  General: Well developed white female in no acute distress Head: Normocephalic and atraumatic Eyes:  Sclerae anicteric, conjunctiva pink. Ears: Normal auditory acuity Lungs: Clear throughout to auscultation; no W/R/R. Heart: Regular rate and rhythm; no M/R/G. Abdomen: Soft, non-distended.  BS present.  Mild RUQ TTP. Musculoskeletal: Symmetrical with no gross deformities  Skin: No lesions on visible extremities Extremities: No edema  Neurological: Alert oriented x 4, grossly non-focal Psychological:  Alert and cooperative. Normal mood and affect  ASSESSMENT AND PLAN: *28 year old female with complaints of extreme belching, nausea, right upper quadrant abdominal pain, and some reflux.  Symptoms present for the past several months.  Right upper  quadrant abdominal ultrasound unremarkable for cause of symptoms.  Trial of famotidine 40 mg daily not helpful.  Will plan for HIDA scan with CCK to rule out biliary dysfunction.  Will give a trial of pantoprazole 40 mg daily.  Prescription sent to pharmacy.  I would like her to try this for the next 4 to 6 weeks.  Question if anxiety is contributing to her symptoms as well.  Will plan to touch base via MyChart with results and then with an update after taking the medication as instructed  above.   CC:  Pincus Sanes, MD

## 2023-04-11 NOTE — Patient Instructions (Signed)
We have sent the following medications to your pharmacy for you to pick up at your convenience: Pantoprazole 40 mg daily 30-60 minutes before breakfast.  You have been scheduled for a HIDA scan at [facility] (1st floor) on Monday 04/23/23. Please arrive 30 minutes prior to your scheduled appointment at  8 am. Make certain not to have anything to eat or drink at least 6 hours prior to your test. Should this appointment date or time not work well for you, please call radiology scheduling at [phone].  _____________________________________________________________________ hepatobiliary (HIDA) scan is an imaging procedure used to diagnose problems in the liver, gallbladder and bile ducts. In the HIDA scan, a radioactive chemical or tracer is injected into a vein in your arm. The tracer is handled by the liver like bile. Bile is a fluid produced and excreted by your liver that helps your digestive system break down fats in the foods you eat. Bile is stored in your gallbladder and the gallbladder releases the bile when you eat a meal. A special nuclear medicine scanner (gamma camera) tracks the flow of the tracer from your liver into your gallbladder and small intestine.  During your HIDA scan  You'll be asked to change into a hospital gown before your HIDA scan begins. Your health care team will position you on a table, usually on your back. The radioactive tracer is then injected into a vein in your arm.The tracer travels through your bloodstream to your liver, where it's taken up by the bile-producing cells. The radioactive tracer travels with the bile from your liver into your gallbladder and through your bile ducts to your small intestine.You may feel some pressure while the radioactive tracer is injected into your vein. As you lie on the table, a special gamma camera is positioned over your abdomen taking pictures of the tracer as it moves through your body. The gamma camera takes pictures  continually for about an hour. You'll need to keep still during the HIDA scan. This can become uncomfortable, but you may find that you can lessen the discomfort by taking deep breaths and thinking about other things. Tell your health care team if you're uncomfortable. The radiologist will watch on a computer the progress of the radioactive tracer through your body. The HIDA scan may be stopped when the radioactive tracer is seen in the gallbladder and enters your small intestine. This typically takes about an hour. In some cases extra imaging will be performed if original images aren't satisfactory, if morphine is given to help visualize the gallbladder or if the medication CCK is given to look at the contraction of the gallbladder. This test typically takes 2 hours to complete. ________________________________________________________________  _______________________________________________________  If your blood pressure at your visit was 140/90 or greater, please contact your primary care physician to follow up on this.  _______________________________________________________  If you are age 29 or older, your body mass index should be between 23-30. Your Body mass index is 30.31 kg/m. If this is out of the aforementioned range listed, please consider follow up with your Primary Care Provider.  If you are age 86 or younger, your body mass index should be between 19-25. Your Body mass index is 30.31 kg/m. If this is out of the aformentioned range listed, please consider follow up with your Primary Care Provider.   ________________________________________________________  The [facility] providers would like to encourage you to use [facility] to communicate with providers for non-urgent requests or questions.  Due to long hold  times on the telephone, sending your provider a message by [facility] may be a faster and more efficient way to get a response.  Please allow 48 business hours for a response.   Please remember that this is for non-urgent requests.  _______________________________________________________

## 2023-04-12 LAB — GLIADIN ANTIBODIES, SERUM
Gliadin IgA: <1 U/mL
Gliadin IgG: <1 U/mL

## 2023-04-12 LAB — TISSUE TRANSGLUTAMINASE, IGA: (tTG) Ab, IgA: <1 U/mL

## 2023-04-13 LAB — RETICULIN ANTIBODIES, IGA W TITER: Reticulin Ab, IgA: NEGATIVE {titer} (ref <2.5)

## 2023-04-14 ENCOUNTER — Encounter: Payer: Self-pay | Admitting: Internal Medicine

## 2023-04-17 ENCOUNTER — Telehealth: Payer: Self-pay

## 2023-04-17 NOTE — Telephone Encounter (Signed)
-----   Message from [place] D. Zehr sent at 04/17/2023 12:25 PM EST ----- Marissa [name], are you able to see if you can do the peer to peer to get it approved? ----- Message ----- From: Franz Dell Sent: 04/17/2023   9:33 AM EST To: Loretha Stapler, RN; Princella Pellegrini Zehr, PA-C  Good morning ladies, This patient's insurance will NOT approve her  [facility]PAT[facility] [employer] [employer] GB W/PHARMA INTERVENJ unless a peer-to-peer is done.  The number to call is [phone] Order ID# : [id number]  Member QM:[id number]  Thank you.

## 2023-04-17 NOTE — Telephone Encounter (Signed)
Called to get peer to peer and was told that it is already approved from 2/14-3-15-2025. Peer to peer not needed.

## 2023-04-23 ENCOUNTER — Ambulatory Visit (HOSPITAL_COMMUNITY): Payer: BC Managed Care – PPO

## 2023-04-23 ENCOUNTER — Encounter (HOSPITAL_COMMUNITY): Payer: BC Managed Care – PPO

## 2023-05-09 ENCOUNTER — Ambulatory Visit: Payer: BC Managed Care – PPO | Admitting: Internal Medicine

## 2023-05-11 ENCOUNTER — Telehealth: Payer: Self-pay | Admitting: *Deleted

## 2023-05-11 DIAGNOSIS — R110 Nausea: Secondary | ICD-10-CM

## 2023-05-11 DIAGNOSIS — R1011 Right upper quadrant pain: Secondary | ICD-10-CM

## 2023-05-11 DIAGNOSIS — R142 Eructation: Secondary | ICD-10-CM

## 2023-05-11 DIAGNOSIS — K219 Gastro-esophageal reflux disease without esophagitis: Secondary | ICD-10-CM

## 2023-05-11 NOTE — Telephone Encounter (Signed)
 Spoke with patient she doesn't believe she has noticed any improvement with Pantoprazole. Patient lives in [place] and is wondering if she can do a different H. Pylori test.

## 2023-05-11 NOTE — Telephone Encounter (Signed)
-----   Message from [place] D. Zehr sent at 05/09/2023  4:32 PM EDT ----- Please reach out to the patient.  We know that she did not proceed with her HIDA scan due to cost.  Lets do a stool Diatherix for H. pylori to rule that out as a possible issue.  If she doing any better on the pantoprazole 40 mg daily? ----- Message ----- From: Jenel Lucks, MD Sent: 04/11/2023   1:22 PM EDT To: Leta Baptist, PA-C     ----- Message ----- From: Leta Baptist, PA-C Sent: 04/11/2023   9:14 AM EST To: Jenel Lucks, MD

## 2023-05-15 NOTE — Telephone Encounter (Signed)
 Patient informed to hold Pantoprazole for 2 weeks then complete stool study for H. Pylori test. Informed patient she can pick up the kit from a local [facility] close to her in [place]. Patient voiced understanding.

## 2023-05-15 NOTE — Telephone Encounter (Signed)
 Sent lab requisition to patient's home.

## 2023-05-15 NOTE — Telephone Encounter (Signed)
 Left message for patient to call office.

## 2023-05-30 ENCOUNTER — Other Ambulatory Visit: Payer: Self-pay | Admitting: Internal Medicine

## 2023-06-20 DIAGNOSIS — N390 Urinary tract infection, site not specified: Secondary | ICD-10-CM | POA: Diagnosis not present

## 2023-06-20 DIAGNOSIS — N760 Acute vaginitis: Secondary | ICD-10-CM | POA: Diagnosis not present

## 2023-06-20 DIAGNOSIS — R300 Dysuria: Secondary | ICD-10-CM | POA: Diagnosis not present

## 2023-06-20 DIAGNOSIS — Z319 Encounter for procreative management, unspecified: Secondary | ICD-10-CM | POA: Diagnosis not present

## 2023-08-05 ENCOUNTER — Other Ambulatory Visit: Payer: Self-pay | Admitting: Internal Medicine

## 2023-10-18 ENCOUNTER — Encounter: Payer: Self-pay | Admitting: Internal Medicine

## 2023-10-18 DIAGNOSIS — K219 Gastro-esophageal reflux disease without esophagitis: Secondary | ICD-10-CM

## 2023-10-18 DIAGNOSIS — R142 Eructation: Secondary | ICD-10-CM

## 2023-11-01 ENCOUNTER — Encounter: Payer: Self-pay | Admitting: Internal Medicine

## 2023-11-01 NOTE — Progress Notes (Unsigned)
    Subjective:    Patient ID: Marissa [name], female    DOB: Dec 28, 1995, 28 y.o.   MRN: [id number]      HPI Marissa [name] is here for No chief complaint on file.        Medications and allergies reviewed with patient and updated if appropriate.  Current Outpatient Medications on File Prior to Visit  Medication Sig Dispense Refill   ASHWAGANDHA GUMMIES PO Take by mouth. (Patient not taking: Reported on 04/11/2023)     buPROPion  (WELLBUTRIN  XL) 300 MG 24 hr tablet TAKE 1 TABLET BY MOUTH EVERY DAY (Patient not taking: Reported on 04/11/2023) 90 tablet 1   famotidine  (PEPCID ) 40 MG tablet TAKE 1 TABLET BY MOUTH EVERY DAY (Patient not taking: Reported on 04/11/2023) 90 tablet 1   FLUoxetine  (PROZAC ) 20 MG capsule TAKE 1 CAPSULE BY MOUTH EVERY DAY AS DIRECTED 30 capsule 1   levonorgestrel (KYLEENA) 19.5 MG IUD Take 1 device by intrauterine route. (Patient not taking: Reported on 04/11/2023)     MAGNESIUM GLYCINATE PO Take 1 capsule by mouth daily.     pantoprazole  (PROTONIX ) 40 MG tablet Take 1 tablet (40 mg total) by mouth daily. 30 tablet 5   Prenatal Vit-Fe Fumarate-FA (PRENATAL VITAMIN PO) Take 1 capsule by mouth daily.     No current facility-administered medications on file prior to visit.    Review of Systems     Objective:  There were no vitals filed for this visit. BP Readings from Last 3 Encounters:  04/11/23 122/80  12/19/22 112/72  06/28/22 102/80   Wt Readings from Last 3 Encounters:  04/11/23 205 lb 4 oz (93.1 kg)  12/19/22 190 lb (86.2 kg)  06/28/22 205 lb (93 kg)   There is no height or weight on file to calculate BMI.    Physical Exam         Assessment & Plan:    See Problem List for Assessment and Plan of chronic medical problems.

## 2023-11-02 ENCOUNTER — Ambulatory Visit (INDEPENDENT_AMBULATORY_CARE_PROVIDER_SITE_OTHER): Admitting: Internal Medicine

## 2023-11-02 VITALS — BP 106/70 | HR 90 | Temp 98.0°F | Ht 69.0 in | Wt 211.0 lb

## 2023-11-02 DIAGNOSIS — F419 Anxiety disorder, unspecified: Secondary | ICD-10-CM

## 2023-11-02 DIAGNOSIS — R2991 Unspecified symptoms and signs involving the musculoskeletal system: Secondary | ICD-10-CM

## 2023-11-02 DIAGNOSIS — R200 Anesthesia of skin: Secondary | ICD-10-CM

## 2023-11-02 DIAGNOSIS — R202 Paresthesia of skin: Secondary | ICD-10-CM

## 2023-11-02 NOTE — Assessment & Plan Note (Signed)
 Chronic Currently controlled Continue Prozac  20 mg daily

## 2023-11-02 NOTE — Patient Instructions (Signed)
    If your symptoms recur or you want further evaluation.    [facility] - [phone]

## 2023-11-02 NOTE — Assessment & Plan Note (Signed)
 She had 2 episodes of numbness/tingling in her right leg-started in her right mid thigh and extended down to her toes Symptoms lasted about 1 week Her chiropractor did not feel it was coming from the back Denies any symptoms currently and has not had symptoms since those 2 episodes Discussed possibly seeing sports medicine, but her since her symptoms have resolved I would wait until they recur again and then maybe see them at that time

## 2023-11-02 NOTE — Assessment & Plan Note (Signed)
 Right distal anterior thigh she has a slight indentation in her quadricep She first noticed in May there was a bruise there and swelling and indentation Indentation has persisted Likely there was some injury there that she did not realize I do not think there is any muscle atrophy She denies any weakness Hard to say if the numbness and tingling from the leg was related or not Currently asymptomatic Discussed sports medicine evaluation-since she is asymptomatic and has not had any symptoms recently we will hold off and if symptoms recur we can get her into see sports medicine for further evaluation

## 2023-11-04 ENCOUNTER — Other Ambulatory Visit: Payer: Self-pay | Admitting: Internal Medicine

## 2023-11-27 DIAGNOSIS — N926 Irregular menstruation, unspecified: Secondary | ICD-10-CM | POA: Diagnosis not present

## 2023-11-27 DIAGNOSIS — Z3201 Encounter for pregnancy test, result positive: Secondary | ICD-10-CM | POA: Diagnosis not present

## 2023-12-06 ENCOUNTER — Other Ambulatory Visit: Payer: Self-pay | Admitting: Internal Medicine

## 2023-12-06 DIAGNOSIS — O219 Vomiting of pregnancy, unspecified: Secondary | ICD-10-CM | POA: Diagnosis not present

## 2023-12-06 DIAGNOSIS — Z3A01 Less than 8 weeks gestation of pregnancy: Secondary | ICD-10-CM | POA: Diagnosis not present

## 2023-12-06 DIAGNOSIS — Z113 Encounter for screening for infections with a predominantly sexual mode of transmission: Secondary | ICD-10-CM | POA: Diagnosis not present

## 2023-12-06 DIAGNOSIS — O368391 Maternal care for abnormalities of the fetal heart rate or rhythm, unspecified trimester, fetus 1: Secondary | ICD-10-CM | POA: Diagnosis not present

## 2023-12-06 DIAGNOSIS — Z3201 Encounter for pregnancy test, result positive: Secondary | ICD-10-CM | POA: Diagnosis not present

## 2023-12-06 DIAGNOSIS — O99341 Other mental disorders complicating pregnancy, first trimester: Secondary | ICD-10-CM | POA: Diagnosis not present

## 2023-12-06 DIAGNOSIS — O99011 Anemia complicating pregnancy, first trimester: Secondary | ICD-10-CM | POA: Diagnosis not present

## 2023-12-06 DIAGNOSIS — Z1329 Encounter for screening for other suspected endocrine disorder: Secondary | ICD-10-CM | POA: Diagnosis not present

## 2023-12-12 DIAGNOSIS — F3289 Other specified depressive episodes: Secondary | ICD-10-CM | POA: Diagnosis not present

## 2023-12-13 DIAGNOSIS — O368399 Maternal care for abnormalities of the fetal heart rate or rhythm, unspecified trimester, other fetus: Secondary | ICD-10-CM | POA: Diagnosis not present

## 2023-12-13 DIAGNOSIS — O210 Mild hyperemesis gravidarum: Secondary | ICD-10-CM | POA: Diagnosis not present

## 2023-12-13 DIAGNOSIS — R748 Abnormal levels of other serum enzymes: Secondary | ICD-10-CM | POA: Diagnosis not present

## 2023-12-19 DIAGNOSIS — R748 Abnormal levels of other serum enzymes: Secondary | ICD-10-CM | POA: Diagnosis not present

## 2024-01-01 DIAGNOSIS — Z36 Encounter for antenatal screening for chromosomal anomalies: Secondary | ICD-10-CM | POA: Diagnosis not present

## 2024-01-01 DIAGNOSIS — Z3A10 10 weeks gestation of pregnancy: Secondary | ICD-10-CM | POA: Diagnosis not present

## 2024-01-01 DIAGNOSIS — Z31430 Encounter of female for testing for genetic disease carrier status for procreative management: Secondary | ICD-10-CM | POA: Diagnosis not present

## 2024-01-01 DIAGNOSIS — Z3401 Encounter for supervision of normal first pregnancy, first trimester: Secondary | ICD-10-CM | POA: Diagnosis not present

## 2024-01-01 DIAGNOSIS — Z3687 Encounter for antenatal screening for uncertain dates: Secondary | ICD-10-CM | POA: Diagnosis not present

## 2024-01-02 DIAGNOSIS — F3289 Other specified depressive episodes: Secondary | ICD-10-CM | POA: Diagnosis not present

## 2024-01-14 DIAGNOSIS — R7989 Other specified abnormal findings of blood chemistry: Secondary | ICD-10-CM | POA: Diagnosis not present

## 2024-01-14 DIAGNOSIS — R7401 Elevation of levels of liver transaminase levels: Secondary | ICD-10-CM | POA: Diagnosis not present

## 2024-01-14 DIAGNOSIS — Z3A12 12 weeks gestation of pregnancy: Secondary | ICD-10-CM | POA: Diagnosis not present

## 2024-01-14 DIAGNOSIS — K5900 Constipation, unspecified: Secondary | ICD-10-CM | POA: Diagnosis not present

## 2024-01-14 DIAGNOSIS — R142 Eructation: Secondary | ICD-10-CM | POA: Diagnosis not present

## 2024-01-29 DIAGNOSIS — R7989 Other specified abnormal findings of blood chemistry: Secondary | ICD-10-CM | POA: Diagnosis not present

## 2024-01-29 DIAGNOSIS — R7401 Elevation of levels of liver transaminase levels: Secondary | ICD-10-CM | POA: Diagnosis not present
